# Patient Record
Sex: Female | Born: 1980 | Race: Black or African American | Hispanic: No | Marital: Married | Smoking: Never smoker
Health system: Southern US, Community
[De-identification: ages and names within clinical notes are randomized; demographics above are authoritative.]

## PROBLEM LIST (undated history)

## (undated) DIAGNOSIS — I10 Essential (primary) hypertension: Secondary | ICD-10-CM

## (undated) DIAGNOSIS — D649 Anemia, unspecified: Secondary | ICD-10-CM

## (undated) HISTORY — PX: TUBAL LIGATION: SHX77

---

## 2011-08-22 ENCOUNTER — Emergency Department (HOSPITAL_BASED_OUTPATIENT_CLINIC_OR_DEPARTMENT_OTHER)
Admission: EM | Admit: 2011-08-22 | Discharge: 2011-08-22 | Disposition: A | Discharge status: Home / Self Care | Payer: Self-pay | Attending: Emergency Medicine | Admitting: Emergency Medicine

## 2011-08-22 ENCOUNTER — Encounter (HOSPITAL_BASED_OUTPATIENT_CLINIC_OR_DEPARTMENT_OTHER): Payer: Self-pay | Admitting: *Deleted

## 2011-08-22 DIAGNOSIS — A499 Bacterial infection, unspecified: Secondary | ICD-10-CM | POA: Insufficient documentation

## 2011-08-22 DIAGNOSIS — B9689 Other specified bacterial agents as the cause of diseases classified elsewhere: Secondary | ICD-10-CM | POA: Insufficient documentation

## 2011-08-22 DIAGNOSIS — I10 Essential (primary) hypertension: Secondary | ICD-10-CM | POA: Insufficient documentation

## 2011-08-22 DIAGNOSIS — N760 Acute vaginitis: Secondary | ICD-10-CM | POA: Insufficient documentation

## 2011-08-22 HISTORY — DX: Essential (primary) hypertension: I10

## 2011-08-22 LAB — WET PREP, GENITAL
Trich, Wet Prep: NONE SEEN
Yeast Wet Prep HPF POC: NONE SEEN

## 2011-08-22 LAB — URINALYSIS, ROUTINE W REFLEX MICROSCOPIC
Glucose, UA: NEGATIVE mg/dL
Leukocytes, UA: NEGATIVE
Nitrite: NEGATIVE
Specific Gravity, Urine: 1.031 — ABNORMAL HIGH (ref 1.005–1.030)
pH: 5.5 (ref 5.0–8.0)

## 2011-08-22 LAB — GC/CHLAMYDIA PROBE AMP, GENITAL: GC Probe Amp, Genital: NEGATIVE

## 2011-08-22 LAB — URINE MICROSCOPIC-ADD ON

## 2011-08-22 MED ORDER — METRONIDAZOLE 500 MG PO TABS
500.0000 mg | ORAL_TABLET | Freq: Once | ORAL | Status: AC
  Administered 2011-08-22: 500 mg via ORAL
  Filled 2011-08-22: qty 1

## 2011-08-22 MED ORDER — METRONIDAZOLE 500 MG PO TABS: 500.0000 mg | ORAL_TABLET | Freq: Two times a day (BID) | ORAL | Status: AC

## 2011-08-22 NOTE — Discharge Instructions (Signed)
Please take all of your antibiotics. Bacterial Vaginosis Bacterial vaginosis (BV) is a vaginal infection where the normal balance of bacteria in the vagina is disrupted. The normal balance is then replaced by an overgrowth of certain bacteria. There are several different kinds of bacteria that can cause BV. BV is the most common vaginal infection in women of childbearing age. CAUSES   The cause of BV is not fully understood. BV develops when there is an increase or imbalance of harmful bacteria.   Some activities or behaviors can upset the normal balance of bacteria in the vagina and put women at increased risk including:   Having a new sex partner or multiple sex partners.   Douching.   Using an intrauterine device (IUD) for contraception.   It is not clear what role sexual activity plays in the development of BV. However, women that have never had sexual intercourse are rarely infected with BV.  Women do not get BV from toilet seats, bedding, swimming pools or from touching objects around them.  SYMPTOMS   Grey vaginal discharge.   A fish-like odor with discharge, especially after sexual intercourse.   Itching or burning of the vagina and vulva.   Burning or pain with urination.   Some women have no signs or symptoms at all.  DIAGNOSIS  Your caregiver must examine the vagina for signs of BV. Your caregiver will perform lab tests and look at the sample of vaginal fluid through a microscope. They will look for bacteria and abnormal cells (clue cells), a pH test higher than 4.5, and a positive amine test all associated with BV.  RISKS AND COMPLICATIONS   Pelvic inflammatory disease (PID).   Infections following gynecology surgery.   Developing HIV.   Developing herpes virus.  TREATMENT  Sometimes BV will clear up without treatment. However, all women with symptoms of BV should be treated to avoid complications, especially if gynecology surgery is planned. Female partners  generally do not need to be treated. However, BV may spread between female sex partners so treatment is helpful in preventing a recurrence of BV.   BV may be treated with antibiotics. The antibiotics come in either pill or vaginal cream forms. Either can be used with nonpregnant or pregnant women, but the recommended dosages differ. These antibiotics are not harmful to the baby.   BV can recur after treatment. If this happens, a second round of antibiotics will often be prescribed.   Treatment is important for pregnant women. If not treated, BV can cause a premature delivery, especially for a pregnant woman who had a premature birth in the past. All pregnant women who have symptoms of BV should be checked and treated.   For chronic reoccurrence of BV, treatment with a type of prescribed gel vaginally twice a week is helpful.  HOME CARE INSTRUCTIONS   Finish all medication as directed by your caregiver.   Do not have sex until treatment is completed.   Tell your sexual partner that you have a vaginal infection. They should see their caregiver and be treated if they have problems, such as a mild rash or itching.   Practice safe sex. Use condoms. Only have 1 sex partner.  PREVENTION  Basic prevention steps can help reduce the risk of upsetting the natural balance of bacteria in the vagina and developing BV:  Do not have sexual intercourse (be abstinent).   Do not douche.   Use all of the medicine prescribed for treatment of BV, even if  the signs and symptoms go away.   Tell your sex partner if you have BV. That way, they can be treated, if needed, to prevent reoccurrence.  SEEK MEDICAL CARE IF:   Your symptoms are not improving after 3 days of treatment.   You have increased discharge, pain, or fever.  MAKE SURE YOU:   Understand these instructions.   Will watch your condition.   Will get help right away if you are not doing well or get worse.  FOR MORE INFORMATION  Division of  STD Prevention (DSTDP), Centers for Disease Control and Prevention: [employer] American Social Health Association (ASHA): www.ashastd.org  Document Released: 03/17/2005 Document Revised: 03/06/2011 Document Reviewed: 09/07/2008 [facility] Patient Information 2012 [place], [place].

## 2011-08-22 NOTE — ED Notes (Signed)
Pt amb to room 9 with quick steady gait smiling in nad.  Pt reports 3 weeks of vaginal discharge, itching and foul odor. rx flagyl by her pcp, took 4 days of it then stopped, "I thought it was gone..." symptoms resolved, then returned when she used a new body wash.

## 2011-08-22 NOTE — ED Provider Notes (Signed)
History     [name]: [id number]  Arrival date & time 08/22/11  8295   First MD Initiated Contact with Patient 08/22/11 [phone]      Chief Complaint  Patient presents with  . Vaginal Discharge    (Consider location/radiation/quality/duration/timing/severity/associated sxs/prior treatment) HPI Patient is a 31 year old female with history of bacterial vaginosis who presents today complaining of foul-smelling vaginal discharge. Patient was last treated for bacterial vaginosis 2 weeks ago. She only took 4 of her 7 days of antibiotics as she thought her symptoms were gone. Patient reports that her symptoms did not recur a total week after this. Patient said that her symptoms have just been present for one day. She has no suspicion that she could have a sexual transmitted disease. She does not that she could be pregnant as she had tubal ligation in August. Patient endorses small amount of foul-smelling vaginal discharge. She denies nausea, vomiting, abdominal pain, or dysuria. Patient has absolutely no urinary symptoms. We did discuss that bacterial vaginosis only needs to be treated in her case if she is bothered by her symptoms. Patient did report that she felt her symptoms were significant enough that she would like to be treated if this is what she has today. She denies pain Past Medical History  Diagnosis Date  . Hypertension     History reviewed. No pertinent past surgical history.  History reviewed. No pertinent family history.  History  Substance Use Topics  . Smoking status: Not on file  . Smokeless tobacco: Not on file  . Alcohol Use:     OB History    Grav Para Term Preterm Abortions TAB SAB Ect Mult Living                  Review of Systems  Constitutional: Negative.   HENT: Negative.   Eyes: Negative.   Respiratory: Negative.   Cardiovascular: Negative.   Gastrointestinal: Negative.   Genitourinary: Positive for vaginal discharge.  Musculoskeletal: Negative.   Skin:  Negative.   Neurological: Negative.   Hematological: Negative.   Psychiatric/Behavioral: Negative.   All other systems reviewed and are negative.    Allergies  Review of patient's allergies indicates no known allergies.  Home Medications  No current outpatient prescriptions on file.  BP 146/98  Pulse 89  Temp(Src) 98.2 F (36.8 C) (Oral)  Resp 18  SpO2 100%  LMP 07/27/2011  Physical Exam  Nursing note and vitals reviewed. GEN: Well-developed, well-nourished female in no distress HEENT: Atraumatic, normocephalic. EYES: PERRLA BL, no scleral icterus. NECK: Trachea midline, no meningismus CV: regular rate and rhythm. No murmurs rubs or gallops. PULM: No respiratory distress.  No crackles, wheezes, or rales. GI: soft, non-tender. No guarding, rebound, or tenderness. + bowel sounds  GU: Speculum exam with moderate amount of fishy smelling yellowish vaginal discharge, no cervical or adnexal motion tenderness Neuro: cranial nerves 2-12 grossly intact, no abnormalities of strength or sensation, A and O x 3 MSK: Patient moves all 4 extremities symmetrically, no deformity, edema, or injury noted Skin: No rashes petechiae, purpura, or jaundice Psych: no abnormality of mood   ED Course  Procedures (including critical care time)  Labs Reviewed  URINALYSIS, ROUTINE W REFLEX MICROSCOPIC - Abnormal; Notable for the following:    APPearance CLOUDY (*)    Specific Gravity, Urine 1.031 (*)    Hgb urine dipstick TRACE (*)    All other components within normal limits  WET PREP, GENITAL - Abnormal; Notable for the following:  Clue Cells Wet Prep HPF POC FEW (*)    WBC, Wet Prep HPF POC FEW (*)    All other components within normal limits  URINE MICROSCOPIC-ADD ON - Abnormal; Notable for the following:    Squamous Epithelial / LPF MANY (*)    Bacteria, UA FEW (*)    All other components within normal limits  PREGNANCY, URINE  GC/CHLAMYDIA PROBE AMP, GENITAL   No results  found.   1. Bacterial vaginosis       MDM  Patient was evaluated by myself. Based on presentation pelvic exam was performed. Patient had no abdominal pain or other concerning symptoms. Urinalysis, urine pregnancy, gonorrhea and Chlamydia, and wet prep were all checked today. Pelvic exam was consistent with bacterial vaginosis. Pregnancy was negative, urinalysis was unremarkable, and wet prep did show clue cells. Patient was given a dose of Flagyl here and discharged with a seven-day prescription for this. She can followup with her regular doctor as needed.        Cyndra Numbers, MD 08/22/11 [phone]

## 2012-07-15 ENCOUNTER — Emergency Department (HOSPITAL_BASED_OUTPATIENT_CLINIC_OR_DEPARTMENT_OTHER)
Admission: EM | Admit: 2012-07-15 | Discharge: 2012-07-15 | Disposition: A | Discharge status: Home / Self Care | Payer: Self-pay | Attending: Emergency Medicine | Admitting: Emergency Medicine

## 2012-07-15 ENCOUNTER — Emergency Department (HOSPITAL_BASED_OUTPATIENT_CLINIC_OR_DEPARTMENT_OTHER): Payer: Self-pay

## 2012-07-15 ENCOUNTER — Encounter (HOSPITAL_BASED_OUTPATIENT_CLINIC_OR_DEPARTMENT_OTHER): Payer: Self-pay | Admitting: *Deleted

## 2012-07-15 DIAGNOSIS — Y990 Civilian activity done for income or pay: Secondary | ICD-10-CM | POA: Insufficient documentation

## 2012-07-15 DIAGNOSIS — X503XXA Overexertion from repetitive movements, initial encounter: Secondary | ICD-10-CM | POA: Insufficient documentation

## 2012-07-15 DIAGNOSIS — Y9289 Other specified places as the place of occurrence of the external cause: Secondary | ICD-10-CM | POA: Insufficient documentation

## 2012-07-15 DIAGNOSIS — S63509A Unspecified sprain of unspecified wrist, initial encounter: Secondary | ICD-10-CM | POA: Insufficient documentation

## 2012-07-15 DIAGNOSIS — Z79899 Other long term (current) drug therapy: Secondary | ICD-10-CM | POA: Insufficient documentation

## 2012-07-15 DIAGNOSIS — I10 Essential (primary) hypertension: Secondary | ICD-10-CM | POA: Insufficient documentation

## 2012-07-15 MED ORDER — OXYCODONE-ACETAMINOPHEN 5-325 MG PO TABS
1.0000 | ORAL_TABLET | Freq: Once | ORAL | Status: AC
  Administered 2012-07-15: 1 via ORAL
  Filled 2012-07-15 (×2): qty 1

## 2012-07-15 MED ORDER — IBUPROFEN 800 MG PO TABS: 800.0000 mg | ORAL_TABLET | Freq: Three times a day (TID) | ORAL | Status: DC

## 2012-07-15 MED ORDER — IBUPROFEN 800 MG PO TABS
800.0000 mg | ORAL_TABLET | Freq: Once | ORAL | Status: AC
  Administered 2012-07-15: 800 mg via ORAL
  Filled 2012-07-15: qty 1

## 2012-07-15 MED ORDER — OXYCODONE-ACETAMINOPHEN 5-325 MG PO TABS: 2.0000 | ORAL_TABLET | ORAL | Status: DC | PRN

## 2012-07-15 NOTE — ED Notes (Signed)
Pt c/o right wrist pain x 2 days w/o injury

## 2012-07-15 NOTE — ED Notes (Signed)
MD at bedside. 

## 2012-07-15 NOTE — ED Provider Notes (Signed)
History     [name]: [id number]  Arrival date & time 07/15/12  1354   First MD Initiated Contact with Patient 07/15/12 1359      Chief Complaint  Patient presents with  . Wrist Pain    (Consider location/radiation/quality/duration/timing/severity/associated sxs/prior treatment) The history is provided by the patient.  ASIYA CUTBIRTH is a 32 y.o. female hx of HTN here with R wrist pain. Woke up yesterday with R wrist pain. Took motrin without much relief. No injury. She does a lot of typing for work. No heavy lifting recently. No hx of arthritis. No numbness or weakness in fingers.    Past Medical History  Diagnosis Date  . Hypertension     Past Surgical History  Procedure Laterality Date  . Tubal ligation      History reviewed. No pertinent family history.  History  Substance Use Topics  . Smoking status: Never Smoker   . Smokeless tobacco: Not on file  . Alcohol Use: No    OB History   Grav Para Term Preterm Abortions TAB SAB Ect Mult Living                  Review of Systems  Musculoskeletal:       R wrist pain   All other systems reviewed and are negative.    Allergies  Review of patient's allergies indicates no known allergies.  Home Medications   Current Outpatient Rx  Name  Route  Sig  Dispense  Refill  . verapamil (COVERA HS) 180 MG (CO) 24 hr tablet   Oral   Take 180 mg by mouth at bedtime.           BP 147/100  Pulse 82  Temp(Src) 98.8 F (37.1 C) (Oral)  Resp 16  Ht 5\' 4"  (1.626 m)  Wt 180 lb (81.647 kg)  BMI 30.88 kg/m2  SpO2 100%  LMP 07/08/2012  Physical Exam  Nursing note and vitals reviewed. Constitutional: She is oriented to person, place, and time. She appears well-developed and well-nourished.  Slightly uncomfortable, holding R wrist   HENT:  Head: Normocephalic.  Eyes: Pupils are equal, round, and reactive to light.  Neck: Normal range of motion. Neck supple.  Cardiovascular: Normal rate.   Pulmonary/Chest: Effort  normal.  Abdominal: Soft.  Musculoskeletal:  R wrist slightly swollen and mildly tender. Dec ROM from pain. 2+ pulses, nl finger ROM. Dec hand grasp due to pain. Good capillary refill. No tenderness over the carpal tunnel. Nl sensation in fingers.   Neurological: She is alert and oriented to person, place, and time.  Skin: Skin is warm and dry.  Psychiatric: She has a normal mood and affect. Her behavior is normal. Judgment and thought content normal.    ED Course  Procedures (including critical care time)  Labs Reviewed - No data to display Dg Wrist Complete Right  07/15/2012  *RADIOLOGY REPORT*  Clinical Data: Wrist pain and swelling.  No known injury.  RIGHT WRIST - COMPLETE 3+ VIEW  Comparison: None.  Findings: The mineralization and alignment are normal.  There is no evidence of acute fracture or dislocation.  The joint spaces are preserved.  The ulnar variance is negative.  There is a questionable tiny radiodensity dorsal to the radiocarpal joint on the lateral view, not well seen on the additional views.  IMPRESSION: No acute osseous findings.  A tiny loose body posteriorly is difficult to exclude, but only clearly seen on one view.   Original Report Authenticated  By: Carey Bullocks, M.D.      No diagnosis found.    MDM  AZALIE HARBECK is a 32 y.o. female here with R wrist strain from likely repetitive motion. Xray showed possible loose body but no fracture. Velcro splint applied. I d/c her home on motrin and short course of percocet. I asked her to get repeat xray with ortho in a week.          Richardean Canal, MD 07/15/12 1452

## 2012-07-22 ENCOUNTER — Ambulatory Visit: Payer: Self-pay | Admitting: Family Medicine

## 2012-07-22 DIAGNOSIS — Z0289 Encounter for other administrative examinations: Secondary | ICD-10-CM

## 2013-10-06 ENCOUNTER — Emergency Department (HOSPITAL_BASED_OUTPATIENT_CLINIC_OR_DEPARTMENT_OTHER)
Admission: EM | Admit: 2013-10-06 | Discharge: 2013-10-06 | Discharge status: Against Medical Advice | Payer: Managed Care, Other (non HMO) | Attending: Emergency Medicine | Admitting: Emergency Medicine

## 2013-10-06 ENCOUNTER — Encounter (HOSPITAL_BASED_OUTPATIENT_CLINIC_OR_DEPARTMENT_OTHER): Payer: Self-pay | Admitting: Emergency Medicine

## 2013-10-06 DIAGNOSIS — I10 Essential (primary) hypertension: Secondary | ICD-10-CM | POA: Insufficient documentation

## 2013-10-06 DIAGNOSIS — H538 Other visual disturbances: Secondary | ICD-10-CM | POA: Insufficient documentation

## 2013-10-06 DIAGNOSIS — H5789 Other specified disorders of eye and adnexa: Secondary | ICD-10-CM | POA: Insufficient documentation

## 2013-10-06 NOTE — ED Notes (Signed)
Pt having left eye irritation and swelling since Tuesday.  Blurred vision at times.

## 2013-10-06 NOTE — ED Notes (Signed)
Pt informed registration that she had an appointment and needed to leave.

## 2014-07-03 ENCOUNTER — Emergency Department (HOSPITAL_BASED_OUTPATIENT_CLINIC_OR_DEPARTMENT_OTHER)
Admission: EM | Admit: 2014-07-03 | Discharge: 2014-07-03 | Disposition: A | Discharge status: Home / Self Care | Payer: Managed Care, Other (non HMO) | Attending: Emergency Medicine | Admitting: Emergency Medicine

## 2014-07-03 ENCOUNTER — Encounter (HOSPITAL_BASED_OUTPATIENT_CLINIC_OR_DEPARTMENT_OTHER): Payer: Self-pay

## 2014-07-03 DIAGNOSIS — R002 Palpitations: Secondary | ICD-10-CM | POA: Insufficient documentation

## 2014-07-03 DIAGNOSIS — I10 Essential (primary) hypertension: Secondary | ICD-10-CM | POA: Diagnosis not present

## 2014-07-03 DIAGNOSIS — L509 Urticaria, unspecified: Secondary | ICD-10-CM | POA: Diagnosis not present

## 2014-07-03 DIAGNOSIS — Z79899 Other long term (current) drug therapy: Secondary | ICD-10-CM | POA: Diagnosis not present

## 2014-07-03 DIAGNOSIS — R110 Nausea: Secondary | ICD-10-CM | POA: Diagnosis not present

## 2014-07-03 LAB — CBC
HEMATOCRIT: 36.3 % (ref 36.0–46.0)
HEMOGLOBIN: 12.4 g/dL (ref 12.0–15.0)
MCH: 31.3 pg (ref 26.0–34.0)
MCHC: 34.2 g/dL (ref 30.0–36.0)
MCV: 91.7 fL (ref 78.0–100.0)
Platelets: 257 10*3/uL (ref 150–400)
RBC: 3.96 MIL/uL (ref 3.87–5.11)
RDW: 12.2 % (ref 11.5–15.5)
WBC: 5.6 10*3/uL (ref 4.0–10.5)

## 2014-07-03 LAB — TROPONIN I: Troponin I: <0.03 ng/mL (ref <0.031)

## 2014-07-03 LAB — BASIC METABOLIC PANEL
ANION GAP: 9 (ref 5–15)
BUN: 11 mg/dL (ref 6–23)
CALCIUM: 9 mg/dL (ref 8.4–10.5)
CO2: 23 mmol/L (ref 19–32)
Chloride: 104 mmol/L (ref 96–112)
Creatinine, Ser: 0.68 mg/dL (ref 0.50–1.10)
GFR calc Af Amer: >90 mL/min (ref >90)
Glucose, Bld: 102 mg/dL — ABNORMAL HIGH (ref 70–99)
Potassium: 3.5 mmol/L (ref 3.5–5.1)
SODIUM: 136 mmol/L (ref 135–145)

## 2014-07-03 MED ORDER — DEXAMETHASONE SODIUM PHOSPHATE 10 MG/ML IJ SOLN
10.0000 mg | Freq: Once | INTRAMUSCULAR | Status: DC
  Filled 2014-07-03: qty 1

## 2014-07-03 MED ORDER — DEXAMETHASONE SODIUM PHOSPHATE 10 MG/ML IJ SOLN: 10.0000 mg | Freq: Once | INTRAMUSCULAR | Status: DC

## 2014-07-03 MED ORDER — DEXAMETHASONE SODIUM PHOSPHATE 10 MG/ML IJ SOLN
10.0000 mg | Freq: Once | INTRAMUSCULAR | Status: AC
Start: 2014-07-03 — End: 2014-07-03
  Administered 2014-07-03: 10 mg via INTRAMUSCULAR

## 2014-07-03 NOTE — ED Provider Notes (Signed)
[name]: [id number]     Arrival date & time 07/03/14  1321 History   First MD Initiated Contact with Patient 07/03/14 1327     Chief Complaint  Patient presents with  . Urticaria  . Palpitations     (Consider location/radiation/quality/duration/timing/severity/associated sxs/prior Treatment) HPI Comments: 34 year old female complaining of a rash 2 days. Reports yesterday she noticed red marks on her arms, spreading towards her back and sides today. Denies difficulty breathing or swallowing. No known allergies. Reports she was around a family member who has multiple cats, and she is not sure if she is allergic to cats. Denies any new soaps, detergents, lotions. No contacts with similar rash. The rash is itchy. No aggravating or alleviating factors. Also reports an episode of heart fluttering that woke her up from sleep earlier today. States this lasted for about 1 minute and went away on its own. At that time, she had associated nausea. Denies chest pain or shortness of breath. History of hypertension, no other history of heart disease. Denies family history of early heart disease. Reports she is under a lot of stress at this time as her grandmother at sick and she has been taking care of her.  Patient is a 34 y.o. female presenting with urticaria and palpitations. The history is provided by the patient.  Urticaria Associated symptoms include nausea and a rash.  Palpitations Associated symptoms: nausea     Past Medical History  Diagnosis Date  . Hypertension    Past Surgical History  Procedure Laterality Date  . Tubal ligation     No family history on file. History  Substance Use Topics  . Smoking status: Never Smoker   . Smokeless tobacco: Not on file  . Alcohol Use: No   OB History    No data available     Review of Systems  Cardiovascular: Positive for palpitations.  Gastrointestinal: Positive for nausea.  Skin: Positive for rash.  All other systems reviewed and are  negative.     Allergies  Review of patient's allergies indicates no known allergies.  Home Medications   Prior to Admission medications   Medication Sig Start Date End Date Taking? Authorizing Provider  imipramine (TOFRANIL) 10 MG tablet Take 10 mg by mouth at bedtime.   Yes Historical Provider, MD  valsartan-hydrochlorothiazide (DIOVAN-HCT) 160-25 MG per tablet Take 1 tablet by mouth daily.   Yes Historical Provider, MD   BP 167/100 mmHg  Pulse 87  Temp(Src) 98.5 F (36.9 C) (Oral)  Resp 18  Ht 5\' 4"  (1.626 m)  Wt 191 lb (86.637 kg)  BMI 32.77 kg/m2  SpO2 100%  LMP 06/03/2014 Physical Exam  Constitutional: She is oriented to person, place, and time. She appears well-developed and well-nourished. No distress.  HENT:  Head: Normocephalic and atraumatic.  Mouth/Throat: Oropharynx is clear and moist.  Eyes: Conjunctivae and EOM are normal. Pupils are equal, round, and reactive to light.  Neck: Normal range of motion. Neck supple. No JVD present.  Cardiovascular: Normal rate, regular rhythm, normal heart sounds and intact distal pulses.   No extremity edema.  Pulmonary/Chest: Effort normal and breath sounds normal. No respiratory distress.  Abdominal: Soft. Bowel sounds are normal. There is no tenderness.  Musculoskeletal: Normal range of motion. She exhibits no edema.  Neurological: She is alert and oriented to person, place, and time. She has normal strength. No sensory deficit.  Speech fluent, goal oriented. Moves limbs without ataxia. Equal grip strength bilateral.  Skin: Skin is warm and dry.  She is not diaphoretic.  Urticaria on bilateral arms, lower back and sides.  Psychiatric: She has a normal mood and affect. Her behavior is normal.  Nursing note and vitals reviewed.   ED Course  Procedures (including critical care time) Labs Review Labs Reviewed  BASIC METABOLIC PANEL - Abnormal; Notable for the following:    Glucose, Bld 102 (*)    All other components  within normal limits  CBC  TROPONIN I    Imaging Review No results found.   EKG Interpretation   Date/Time:  Monday July 03 2014 13:37:59 EDT Ventricular Rate:  77 PR Interval:  176 QRS Duration: 78 QT Interval:  386 QTC Calculation: 436 R Axis:   -19 Text Interpretation:  Normal sinus rhythm Normal ECG No old tracing to  compare Confirmed by MILLER  MD, BRIAN ([id number]) on 07/03/2014 1:41:00 PM      MDM   Final diagnoses:  Urticaria  Palpitations   Patient with urticaria. Non-toxic appearing, NAD. Hypertensive, vitals otherwise stable. No respiratory or airway compromise. Palpitations have since subsided, only lasted for a minute. Admits to being under increased stress. Possibly from the increased stress, however will obtain labs. EKG normal. Doubt PE. PERC negative.  Workup negative. Heart score 1. Palpitations have still not returned. Possibly due to stress of taking care of her grandmother. Dose of Decadron given for urticaria, advised Benadryl for itching. Follow-up with PCP within one week. Stable for discharge. Return precautions given. Patient states understanding of treatment care plan and is agreeable.  Kathrynn Speed, PA-C 07/03/14 1541  Eber Hong, MD 07/03/14 2035

## 2014-07-03 NOTE — ED Notes (Signed)
C/o scattered hives-started yesterday

## 2014-07-03 NOTE — ED Notes (Signed)
Pt also states she had nausea and "heart fluttering" this am that she wants addressed

## 2014-07-03 NOTE — ED Notes (Signed)
PA at bedside.

## 2014-07-03 NOTE — Discharge Instructions (Signed)
You may take Benadryl every 6 hours as needed for itching.  Hives Hives are itchy, red, swollen areas of the skin. They can vary in size and location on your body. Hives can come and go for hours or several days (acute hives) or for several weeks (chronic hives). Hives do not spread from person to person (noncontagious). They may get worse with scratching, exercise, and emotional stress. CAUSES   Allergic reaction to food, additives, or drugs.  Infections, including the common cold.  Illness, such as vasculitis, lupus, or thyroid disease.  Exposure to sunlight, heat, or cold.  Exercise.  Stress.  Contact with chemicals. SYMPTOMS   Red or white swollen patches on the skin. The patches may change size, shape, and location quickly and repeatedly.  Itching.  Swelling of the hands, feet, and face. This may occur if hives develop deeper in the skin. DIAGNOSIS  Your caregiver can usually tell what is wrong by performing a physical exam. Skin or blood tests may also be done to determine the cause of your hives. In some cases, the cause cannot be determined. TREATMENT  Mild cases usually get better with medicines such as antihistamines. Severe cases may require an emergency epinephrine injection. If the cause of your hives is known, treatment includes avoiding that trigger.  HOME CARE INSTRUCTIONS   Avoid causes that trigger your hives.  Take antihistamines as directed by your caregiver to reduce the severity of your hives. Non-sedating or low-sedating antihistamines are usually recommended. Do not drive while taking an antihistamine.  Take any other medicines prescribed for itching as directed by your caregiver.  Wear loose-fitting clothing.  Keep all follow-up appointments as directed by your caregiver. SEEK MEDICAL CARE IF:   You have persistent or severe itching that is not relieved with medicine.  You have painful or swollen joints. SEEK IMMEDIATE MEDICAL CARE IF:   You  have a fever.  Your tongue or lips are swollen.  You have trouble breathing or swallowing.  You feel tightness in the throat or chest.  You have abdominal pain. These problems may be the first sign of a life-threatening allergic reaction. Call your local emergency services (911 in [place] MAKE SURE YOU:   Understand these instructions.  Will watch your condition.  Will get help right away if you are not doing well or get worse. Document Released: 03/17/2005 Document Revised: 03/22/2013 Document Reviewed: 06/10/2011 [facility] Patient Information 2015 [place], [place]. This information is not intended to replace advice given to you by your health care provider. Make sure you discuss any questions you have with your health care provider.  Palpitations A palpitation is the feeling that your heartbeat is irregular or is faster than normal. It may feel like your heart is fluttering or skipping a beat. Palpitations are usually not a serious problem. However, in some cases, you may need further medical evaluation. CAUSES  Palpitations can be caused by:  Smoking.  Caffeine or other stimulants, such as diet pills or energy drinks.  Alcohol.  Stress and anxiety.  Strenuous physical activity.  Fatigue.  Certain medicines.  Heart disease, especially if you have a history of irregular heart rhythms (arrhythmias), such as atrial fibrillation, atrial flutter, or supraventricular tachycardia.  An improperly working pacemaker or defibrillator. DIAGNOSIS  To find the cause of your palpitations, your health care provider will take your medical history and perform a physical exam. Your health care provider may also have you take a test called an ambulatory electrocardiogram (ECG). An ECG  records your heartbeat patterns over a 24-hour period. You may also have other tests, such as:  Transthoracic echocardiogram (TTE). During echocardiography, sound waves are used to evaluate how blood flows  through your heart.  Transesophageal echocardiogram (TEE).  Cardiac monitoring. This allows your health care provider to monitor your heart rate and rhythm in real time.  Holter monitor. This is a portable device that records your heartbeat and can help diagnose heart arrhythmias. It allows your health care provider to track your heart activity for several days, if needed.  Stress tests by exercise or by giving medicine that makes the heart beat faster. TREATMENT  Treatment of palpitations depends on the cause of your symptoms and can vary greatly. Most cases of palpitations do not require any treatment other than time, relaxation, and monitoring your symptoms. Other causes, such as atrial fibrillation, atrial flutter, or supraventricular tachycardia, usually require further treatment. HOME CARE INSTRUCTIONS   Avoid:  Caffeinated coffee, tea, soft drinks, diet pills, and energy drinks.  Chocolate.  Alcohol.  Stop smoking if you smoke.  Reduce your stress and anxiety. Things that can help you relax include:  A method of controlling things in your body, such as your heartbeats, with your mind (biofeedback).  Yoga.  Meditation.  Physical activity such as swimming, jogging, or walking.  Get plenty of rest and sleep. SEEK MEDICAL CARE IF:   You continue to have a fast or irregular heartbeat beyond 24 hours.  Your palpitations occur more often. SEEK IMMEDIATE MEDICAL CARE IF:  You have chest pain or shortness of breath.  You have a severe headache.  You feel dizzy or you faint. MAKE SURE YOU:  Understand these instructions.  Will watch your condition.  Will get help right away if you are not doing well or get worse. Document Released: 03/14/2000 Document Revised: 03/22/2013 Document Reviewed: 05/16/2011 [facility] Patient Information 2015 [place]. This information is not intended to replace advice given to you by your health care provider. Make sure you discuss  any questions you have with your health care provider.

## 2014-11-02 ENCOUNTER — Emergency Department (HOSPITAL_BASED_OUTPATIENT_CLINIC_OR_DEPARTMENT_OTHER)
Admission: EM | Admit: 2014-11-02 | Discharge: 2014-11-02 | Disposition: A | Discharge status: Home / Self Care | Payer: Managed Care, Other (non HMO) | Attending: Emergency Medicine | Admitting: Emergency Medicine

## 2014-11-02 ENCOUNTER — Encounter (HOSPITAL_BASED_OUTPATIENT_CLINIC_OR_DEPARTMENT_OTHER): Payer: Self-pay

## 2014-11-02 DIAGNOSIS — I10 Essential (primary) hypertension: Secondary | ICD-10-CM

## 2014-11-02 DIAGNOSIS — Z862 Personal history of diseases of the blood and blood-forming organs and certain disorders involving the immune mechanism: Secondary | ICD-10-CM | POA: Diagnosis not present

## 2014-11-02 HISTORY — DX: Anemia, unspecified: D64.9

## 2014-11-02 LAB — BASIC METABOLIC PANEL
Anion gap: 8 (ref 5–15)
BUN: 11 mg/dL (ref 6–20)
CHLORIDE: 105 mmol/L (ref 101–111)
CO2: 25 mmol/L (ref 22–32)
CREATININE: 0.74 mg/dL (ref 0.44–1.00)
Calcium: 9.1 mg/dL (ref 8.9–10.3)
GFR calc Af Amer: >60 mL/min (ref >60)
GFR calc non Af Amer: >60 mL/min (ref >60)
Glucose, Bld: 117 mg/dL — ABNORMAL HIGH (ref 65–99)
POTASSIUM: 3.8 mmol/L (ref 3.5–5.1)
SODIUM: 138 mmol/L (ref 135–145)

## 2014-11-02 LAB — CBC WITH DIFFERENTIAL/PLATELET
BASOS PCT: 1 % (ref 0–1)
Basophils Absolute: 0 10*3/uL (ref 0.0–0.1)
EOS ABS: 0.2 10*3/uL (ref 0.0–0.7)
Eosinophils Relative: 4 % (ref 0–5)
HEMATOCRIT: 37.5 % (ref 36.0–46.0)
Hemoglobin: 12.7 g/dL (ref 12.0–15.0)
LYMPHS PCT: 33 % (ref 12–46)
Lymphs Abs: 1.7 10*3/uL (ref 0.7–4.0)
MCH: 31 pg (ref 26.0–34.0)
MCHC: 33.9 g/dL (ref 30.0–36.0)
MCV: 91.5 fL (ref 78.0–100.0)
MONO ABS: 0.4 10*3/uL (ref 0.1–1.0)
Monocytes Relative: 8 % (ref 3–12)
NEUTROS ABS: 2.9 10*3/uL (ref 1.7–7.7)
Neutrophils Relative %: 54 % (ref 43–77)
Platelets: 280 10*3/uL (ref 150–400)
RBC: 4.1 MIL/uL (ref 3.87–5.11)
RDW: 11.9 % (ref 11.5–15.5)
WBC: 5.3 10*3/uL (ref 4.0–10.5)

## 2014-11-02 MED ORDER — VERAPAMIL HCL ER 120 MG PO TBCR: 120.0000 mg | EXTENDED_RELEASE_TABLET | Freq: Every day | ORAL | Status: DC

## 2014-11-02 NOTE — Discharge Instructions (Signed)
Continue to follow your blood pressures at home. If they remain persistently elevated, begin taking the verapamil you have been prescribed today.  Follow-up with your primary Dr. in the next week to be rechecked.   Hypertension Hypertension, commonly called high blood pressure, is when the force of blood pumping through your arteries is too strong. Your arteries are the blood vessels that carry blood from your heart throughout your body. A blood pressure reading consists of a higher number over a lower number, such as 110/72. The higher number (systolic) is the pressure inside your arteries when your heart pumps. The lower number (diastolic) is the pressure inside your arteries when your heart relaxes. Ideally you want your blood pressure below 120/80. Hypertension forces your heart to work harder to pump blood. Your arteries may become narrow or stiff. Having hypertension puts you at risk for heart disease, stroke, and other problems.  RISK FACTORS Some risk factors for high blood pressure are controllable. Others are not.  Risk factors you cannot control include:   Race. You may be at higher risk if you are African American.  Age. Risk increases with age.  Gender. Men are at higher risk than women before age 28 years. After age 26, women are at higher risk than men. Risk factors you can control include:  Not getting enough exercise or physical activity.  Being overweight.  Getting too much fat, sugar, calories, or salt in your diet.  Drinking too much alcohol. SIGNS AND SYMPTOMS Hypertension does not usually cause signs or symptoms. Extremely high blood pressure (hypertensive crisis) may cause headache, anxiety, shortness of breath, and nosebleed. DIAGNOSIS  To check if you have hypertension, your health care provider will measure your blood pressure while you are seated, with your arm held at the level of your heart. It should be measured at least twice using the same arm. Certain  conditions can cause a difference in blood pressure between your right and left arms. A blood pressure reading that is higher than normal on one occasion does not mean that you need treatment. If one blood pressure reading is high, ask your health care provider about having it checked again. TREATMENT  Treating high blood pressure includes making lifestyle changes and possibly taking medicine. Living a healthy lifestyle can help lower high blood pressure. You may need to change some of your habits. Lifestyle changes may include:  Following the DASH diet. This diet is high in fruits, vegetables, and whole grains. It is low in salt, red meat, and added sugars.  Getting at least 2 hours of brisk physical activity every week.  Losing weight if necessary.  Not smoking.  Limiting alcoholic beverages.  Learning ways to reduce stress. If lifestyle changes are not enough to get your blood pressure under control, your health care provider may prescribe medicine. You may need to take more than one. Work closely with your health care provider to understand the risks and benefits. HOME CARE INSTRUCTIONS  Have your blood pressure rechecked as directed by your health care provider.   Take medicines only as directed by your health care provider. Follow the directions carefully. Blood pressure medicines must be taken as prescribed. The medicine does not work as well when you skip doses. Skipping doses also puts you at risk for problems.   Do not smoke.   Monitor your blood pressure at home as directed by your health care provider. SEEK MEDICAL CARE IF:   You think you are having a reaction to  medicines taken.  You have recurrent headaches or feel dizzy.  You have swelling in your ankles.  You have trouble with your vision. SEEK IMMEDIATE MEDICAL CARE IF:  You develop a severe headache or confusion.  You have unusual weakness, numbness, or feel faint.  You have severe chest or abdominal  pain.  You vomit repeatedly.  You have trouble breathing. MAKE SURE YOU:   Understand these instructions.  Will watch your condition.  Will get help right away if you are not doing well or get worse. Document Released: 03/17/2005 Document Revised: 08/01/2013 Document Reviewed: 01/07/2013 [facility] Patient Information 2015 [place], [place]. This information is not intended to replace advice given to you by your health care provider. Make sure you discuss any questions you have with your health care provider.

## 2014-11-02 NOTE — ED Provider Notes (Signed)
[name]: [id number]     Arrival date & time 11/02/14  1507 History   First MD Initiated Contact with Patient 11/02/14 1531     Chief Complaint  Patient presents with  . Hypertension     (Consider location/radiation/quality/duration/timing/severity/associated sxs/prior Treatment) HPI Comments: Patient is a 34 year old female with past medical history of hypertension currently taking no medications. She presents today for evaluation of elevated blood pressure. She was at work this afternoon and her blood pressure was reading 150s over 120s. She was advised by her coworkers and supervisors to come to be checked out. She is otherwise asymptomatic. She denies any chest pain, headache, or shortness of breath. She was taking verapamil up until 2 or 3 months ago but stopped taking this due to her blood pressure improving with diet and exercise. She also has a history of anemia and is requesting to have her iron level checked.  Patient is a 34 y.o. female presenting with hypertension. The history is provided by the patient.  Hypertension This is a new problem. The problem occurs constantly. The problem has not changed since onset.Pertinent negatives include no chest pain, no headaches and no shortness of breath. Nothing aggravates the symptoms. Nothing relieves the symptoms. She has tried nothing for the symptoms. The treatment provided no relief.    Past Medical History  Diagnosis Date  . Hypertension   . Anemia    Past Surgical History  Procedure Laterality Date  . Tubal ligation     No family history on file. History  Substance Use Topics  . Smoking status: Never Smoker   . Smokeless tobacco: Not on file  . Alcohol Use: No   OB History    No data available     Review of Systems  Respiratory: Negative for shortness of breath.   Cardiovascular: Negative for chest pain.  Neurological: Negative for headaches.  All other systems reviewed and are negative.     Allergies  Review of  patient's allergies indicates no known allergies.  Home Medications   Prior to Admission medications   Not on File   BP 129/105 mmHg  Pulse 76  Temp(Src) 98.2 F (36.8 C) (Oral)  Resp 16  Ht 5\' 4"  (1.626 m)  Wt 187 lb (84.823 kg)  BMI 32.08 kg/m2  SpO2 100% Physical Exam  Constitutional: She is oriented to person, place, and time. She appears well-developed and well-nourished. No distress.  HENT:  Head: Normocephalic and atraumatic.  Mouth/Throat: Oropharynx is clear and moist.  Eyes: EOM are normal. Pupils are equal, round, and reactive to light.  Neck: Normal range of motion. Neck supple.  Cardiovascular: Normal rate and regular rhythm.  Exam reveals no gallop and no friction rub.   No murmur heard. Pulmonary/Chest: Effort normal and breath sounds normal. No respiratory distress. She has no wheezes.  Abdominal: Soft. Bowel sounds are normal. She exhibits no distension. There is no tenderness.  Musculoskeletal: Normal range of motion. She exhibits no edema.  Neurological: She is alert and oriented to person, place, and time. No cranial nerve deficit. She exhibits normal muscle tone. Coordination normal.  Skin: Skin is warm and dry. She is not diaphoretic.  Nursing note and vitals reviewed.   ED Course  Procedures (including critical care time) Labs Review Labs Reviewed  BASIC METABOLIC PANEL  CBC WITH DIFFERENTIAL/PLATELET    Imaging Review No results found.   EKG Interpretation None      MDM   Final diagnoses:  None    Laboratory  studies are unremarkable and blood pressures have been slightly elevated while in the ER, however not in need of intervention. There is no evidence for endorgan damage and she is appearing very comfortable. She has been on verapamil in the past and I have agreed to prescribe this for her to begin if her blood pressures are consistently elevated at home.    Geoffery Lyons, MD 11/02/14 [phone]

## 2014-11-02 NOTE — ED Notes (Signed)
C/o HTN-noncompliant with meds x 2-3 months-also requesting to have iron level checked

## 2015-03-22 ENCOUNTER — Encounter (HOSPITAL_BASED_OUTPATIENT_CLINIC_OR_DEPARTMENT_OTHER): Payer: Self-pay

## 2015-03-22 ENCOUNTER — Emergency Department (HOSPITAL_BASED_OUTPATIENT_CLINIC_OR_DEPARTMENT_OTHER)
Admission: EM | Admit: 2015-03-22 | Discharge: 2015-03-22 | Disposition: A | Discharge status: Home / Self Care | Payer: 59 | Attending: Emergency Medicine | Admitting: Emergency Medicine

## 2015-03-22 DIAGNOSIS — Z862 Personal history of diseases of the blood and blood-forming organs and certain disorders involving the immune mechanism: Secondary | ICD-10-CM | POA: Insufficient documentation

## 2015-03-22 DIAGNOSIS — Z79899 Other long term (current) drug therapy: Secondary | ICD-10-CM | POA: Insufficient documentation

## 2015-03-22 DIAGNOSIS — J029 Acute pharyngitis, unspecified: Secondary | ICD-10-CM | POA: Diagnosis not present

## 2015-03-22 DIAGNOSIS — I10 Essential (primary) hypertension: Secondary | ICD-10-CM | POA: Diagnosis not present

## 2015-03-22 LAB — RAPID STREP SCREEN (MED CTR MEBANE ONLY): STREPTOCOCCUS, GROUP A SCREEN (DIRECT): NEGATIVE

## 2015-03-22 MED ORDER — CEPACOL SORE THROAT + COATING 15-5 MG MT LOZG: 1.0000 | LOZENGE | OROMUCOSAL | Status: DC | PRN

## 2015-03-22 NOTE — ED Notes (Signed)
Sore throat x 2 days

## 2015-03-22 NOTE — Discharge Instructions (Signed)

## 2015-03-22 NOTE — ED Notes (Signed)
DC instructions reviewed with pt, discussed Rx as prescribed by medical staff, also encouraging PO fluids and increasing handwashing. Work note also provided by VF CorporationPA-C. Opportunity for questions provided

## 2015-03-22 NOTE — ED Provider Notes (Signed)
[name]: [id number]     Arrival date & time 03/22/15  1649 History   First Warner Initiated Contact with Patient 03/22/15 1812     Chief Complaint  Patient presents with  . Sore Throat     (Consider location/radiation/quality/duration/timing/severity/associated sxs/prior Treatment) HPI   Patient presents to the emergency department with complaints of scratchy and sore throat for 2 days. 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 Her symptoms are made worse by talking in improved with resting her voice. She left work early today and therefore came to the emergency department for further evaluation. She denies having any fevers, ear pain, coughing, chest pain, back pain, nausea, vomiting, diarrhea  Past Medical History  Diagnosis Date  . Hypertension   . Anemia    Past Surgical History  Procedure Laterality Date  . Tubal ligation     No family history on file. Social History  Substance Use Topics  . Smoking status: Never Smoker   . Smokeless tobacco: None  . Alcohol Use: No   OB History    No data available     Review of Systems  Review of Systems All other systems negative except as documented in the HPI. All pertinent positives and negatives as reviewed in the HPI.   Allergies  Review of patient's allergies indicates no known allergies.  Home Medications   Prior to Admission medications   Medication Sig Start Date End Date Taking? Authorizing Provider  FLUoxetine (PROZAC) 20 MG capsule Take 20 mg by mouth daily.   Yes Historical Provider, Warner  imipramine (TOFRANIL) 10 MG tablet Take 10 mg by mouth at bedtime.   Yes Historical Provider, Warner  losartan-hydrochlorothiazide (HYZAAR) 100-25 MG tablet Take 1 tablet by mouth daily.   Yes Historical Provider, Warner  CEPACOL SORE THROAT + COATING 15-5 MG LOZG Use as directed 1 lozenge in the mouth or throat every 2 (two) hours as needed. 03/22/15   Connie Warner [place], PA-C   BP 138/92 mmHg  Pulse 87  Temp(Src) 98.4 F (36.9  C) (Oral)  Resp 18  Ht 5\' 4"  (1.626 m)  Wt 86.183 kg  BMI 32.60 kg/m2  SpO2 99% Physical Exam  Constitutional: She appears well-developed and well-nourished. No distress.  HENT:  Head: Normocephalic and atraumatic.  Right Ear: Tympanic membrane and ear canal normal.  Left Ear: Tympanic membrane and ear canal normal.  Nose: Nose normal.  Mouth/Throat: Uvula is midline and mucous membranes are normal. Posterior oropharyngeal erythema present. No oropharyngeal exudate, posterior oropharyngeal edema or tonsillar abscesses.  Throat is mildly erythematous   Eyes: Pupils are equal, round, and reactive to light.  Neck: Normal range of motion. Neck supple.  Cardiovascular: Normal rate and regular rhythm.   Pulmonary/Chest: Effort normal.  Abdominal: Soft.  No signs of abdominal distention  Musculoskeletal:  No LE swelling  Lymphadenopathy:    She has no cervical adenopathy.       Right cervical: No superficial cervical adenopathy present.      Left cervical: No superficial cervical adenopathy present.  Neurological: She is alert.  Acting at baseline  Skin: Skin is warm and dry. No rash noted.  Nursing note and vitals reviewed.   ED Course  Procedures (including critical care time) Labs Review Labs Reviewed  RAPID STREP SCREEN (NOT AT [facility])  CULTURE, GROUP A STREP    Imaging Review No results found. I have personally reviewed and evaluated these images and lab results as part of my medical decision-making.  EKG Interpretation None     Rapid strep screen (Final result) Component (Lab Inquiry)    Collection Time Result Time Streptococcus, Group A Screen (Direct)   03/22/15 16:55:00 03/22/15 17:21:17  NEGATIVE    (Comment)    (NOTE)  A Rapid Antigen test may result negative if the antigen level in the  sample is below the detection level of this test. The FDA has not  cleared this test as a stand-alone test therefore the rapid antigen  negative result has  reflexed to a Group A Strep culture.                   Culture, Group A Strep (Final result) Component (Lab Inquiry)    Collection Time Result Time Strep A Culture   03/22/15 16:55:00 03/24/15 09:35:26  Negative    (Comment)    (NOTE)  Performed At: [facility]  [address] [place], [place] [id number]  Connie Warner [id number]                 MDM   Final diagnoses:  Sore throat    Negative Strep screen. Continue to stay well-hydrated. Gargle warm salt water and spit it out. Continued to alternate between Tylenol and ibuprofen for pain. May consider over-the-counter Benadryl for additional relief. Followup with your primary care doctor in 5-7 days for recheck of ongoing symptoms that return to emergency department for emergent changing or worsening of symptoms.   Filed Vitals:   03/22/15 1656  BP: 138/92  Pulse: 87  Temp: 98.4 F (36.9 C)  Resp: [id number] PA-C 03/26/15 1430  Connie Porter, Warner 04/03/15 [phone]

## 2015-03-24 LAB — CULTURE, GROUP A STREP: Strep A Culture: NEGATIVE

## 2015-05-22 ENCOUNTER — Encounter (HOSPITAL_BASED_OUTPATIENT_CLINIC_OR_DEPARTMENT_OTHER): Payer: Self-pay

## 2015-05-22 DIAGNOSIS — Z79899 Other long term (current) drug therapy: Secondary | ICD-10-CM | POA: Insufficient documentation

## 2015-05-22 DIAGNOSIS — J069 Acute upper respiratory infection, unspecified: Secondary | ICD-10-CM | POA: Diagnosis not present

## 2015-05-22 DIAGNOSIS — J3489 Other specified disorders of nose and nasal sinuses: Secondary | ICD-10-CM | POA: Diagnosis present

## 2015-05-22 DIAGNOSIS — I10 Essential (primary) hypertension: Secondary | ICD-10-CM | POA: Insufficient documentation

## 2015-05-22 DIAGNOSIS — Z862 Personal history of diseases of the blood and blood-forming organs and certain disorders involving the immune mechanism: Secondary | ICD-10-CM | POA: Insufficient documentation

## 2015-05-22 NOTE — ED Notes (Signed)
Chills, body aches, nonprod cough, sneezing x 3 days-NAD-steady gait

## 2015-05-23 ENCOUNTER — Emergency Department (HOSPITAL_BASED_OUTPATIENT_CLINIC_OR_DEPARTMENT_OTHER)
Admission: EM | Admit: 2015-05-23 | Discharge: 2015-05-23 | Disposition: A | Discharge status: Home / Self Care | Payer: 59 | Attending: Emergency Medicine | Admitting: Emergency Medicine

## 2015-05-23 DIAGNOSIS — B9789 Other viral agents as the cause of diseases classified elsewhere: Secondary | ICD-10-CM

## 2015-05-23 DIAGNOSIS — J988 Other specified respiratory disorders: Secondary | ICD-10-CM

## 2015-05-23 MED ORDER — PSEUDOEPHEDRINE-GUAIFENESIN ER 120-1200 MG PO TB12: 1.0000 | ORAL_TABLET | Freq: Two times a day (BID) | ORAL | Status: DC

## 2015-05-23 MED ORDER — OXYMETAZOLINE HCL 0.05 % NA SOLN
2.0000 | Freq: Two times a day (BID) | NASAL | Status: DC | PRN
  Administered 2015-05-23: 2 via NASAL
  Filled 2015-05-23: qty 15

## 2015-05-23 NOTE — ED Provider Notes (Signed)
[name]: [id number]     Arrival date & time 05/22/15  2200 History   First MD Initiated Contact with Patient 05/23/15 0159     Chief Complaint  Patient presents with  . flu-like symptoms      (Consider location/radiation/quality/duration/timing/severity/associated sxs/prior Treatment) HPI  This is a 35 year old female with a three-day history of flulike symptoms. Specifically she has had body aches, malaise, nasal congestion, sinus congestion and pressure and nonproductive cough. She denies fever, nausea, vomiting, diarrhea, shortness of breath or wheezing. She has been taking over-the-counter medications without relief. She is most concerned about her sinus pressure.  Past Medical History  Diagnosis Date  . Hypertension   . Anemia    Past Surgical History  Procedure Laterality Date  . Tubal ligation     No family history on file. Social History  Substance Use Topics  . Smoking status: Never Smoker   . Smokeless tobacco: None  . Alcohol Use: No   OB History    No data available     Review of Systems  All other systems reviewed and are negative.   Allergies  Review of patient's allergies indicates no known allergies.  Home Medications   Prior to Admission medications   Medication Sig Start Date End Date Taking? Authorizing Provider  FLUoxetine (PROZAC) 20 MG capsule Take 20 mg by mouth daily.    Historical Provider, MD  imipramine (TOFRANIL) 10 MG tablet Take 10 mg by mouth at bedtime.    Historical Provider, MD  losartan-hydrochlorothiazide (HYZAAR) 100-25 MG tablet Take 1 tablet by mouth daily.    Historical Provider, MD   BP 147/109 mmHg  Pulse 96  Temp(Src) 98.6 F (37 C) (Oral)  Resp 16  Ht  (1.626 m)  Wt 189 lb (85.73 kg)  BMI 32.43 kg/m2  SpO2 97%   Physical Exam  General: Well-developed, well-nourished female in no acute distress; appearance consistent with age of record HENT: normocephalic; atraumatic; nasal congestion; pharynx normal Eyes: pupils  equal, round and reactive to light; extraocular muscles intact Neck: supple Heart: regular rate and rhythm Lungs: clear to auscultation bilaterally Abdomen: soft; nondistended; nontender;  bowel sounds present Extremities: No deformity; full range of motion; pulses normal Neurologic: Awake, alert and oriented; motor function intact in all extremities and symmetric; no facial droop Skin: Warm and dry Psychiatric: Normal mood and affect    ED Course  Procedures (including critical care time)   MDM      Paula Libra, MD 05/23/15 4098

## 2015-05-23 NOTE — Discharge Instructions (Signed)
Viral Infections °A viral infection can be caused by different types of viruses. Most viral infections are not serious and resolve on their own. However, some infections may cause severe symptoms and may lead to further complications. °SYMPTOMS °Viruses can frequently cause: °· Minor sore throat. °· Aches and pains. °· Headaches. °· Runny nose. °· Different types of rashes. °· Watery eyes. °· Tiredness. °· Cough. °· Loss of appetite. °· Gastrointestinal infections, resulting in nausea, vomiting, and diarrhea. °These symptoms do not respond to antibiotics because the infection is not caused by bacteria. However, you might catch a bacterial infection following the viral infection. This is sometimes called a "superinfection." Symptoms of such a bacterial infection may include: °· Worsening sore throat with pus and difficulty swallowing. °· Swollen neck glands. °· Chills and a high or persistent fever. °· Severe headache. °· Tenderness over the sinuses. °· Persistent overall ill feeling (malaise), muscle aches, and tiredness (fatigue). °· Persistent cough. °· Yellow, green, or brown mucus production with coughing. °HOME CARE INSTRUCTIONS  °· Only take over-the-counter or prescription medicines for pain, discomfort, diarrhea, or fever as directed by your caregiver. °· Drink enough water and fluids to keep your urine clear or pale yellow. Sports drinks can provide valuable electrolytes, sugars, and hydration. °· Get plenty of rest and maintain proper nutrition. Soups and broths with crackers or rice are fine. °SEEK IMMEDIATE MEDICAL CARE IF:  °· You have severe headaches, shortness of breath, chest pain, neck pain, or an unusual rash. °· You have uncontrolled vomiting, diarrhea, or you are unable to keep down fluids. °· You or your child has an oral temperature above 102° F (38.9° C), not controlled by medicine. °· Your baby is older than 3 months with a rectal temperature of 102° F (38.9° C) or higher. °· Your baby is 3  months old or younger with a rectal temperature of 100.4° F (38° C) or higher. °MAKE SURE YOU:  °· Understand these instructions. °· Will watch your condition. °· Will get help right away if you are not doing well or get worse. °  °This information is not intended to replace advice given to you by your health care provider. Make sure you discuss any questions you have with your health care provider. °  °Document Released: 12/25/2004 Document Revised: 06/09/2011 Document Reviewed: 08/23/2014 °[employer] Interactive Patient Education ©2016 [employer] ° °

## 2015-06-06 ENCOUNTER — Encounter (HOSPITAL_BASED_OUTPATIENT_CLINIC_OR_DEPARTMENT_OTHER): Payer: Self-pay | Admitting: Emergency Medicine

## 2015-06-06 ENCOUNTER — Emergency Department (HOSPITAL_BASED_OUTPATIENT_CLINIC_OR_DEPARTMENT_OTHER): Payer: 59

## 2015-06-06 ENCOUNTER — Emergency Department (HOSPITAL_BASED_OUTPATIENT_CLINIC_OR_DEPARTMENT_OTHER)
Admission: EM | Admit: 2015-06-06 | Discharge: 2015-06-06 | Disposition: A | Discharge status: Home / Self Care | Payer: 59 | Attending: Emergency Medicine | Admitting: Emergency Medicine

## 2015-06-06 DIAGNOSIS — Y998 Other external cause status: Secondary | ICD-10-CM | POA: Diagnosis not present

## 2015-06-06 DIAGNOSIS — Y92410 Unspecified street and highway as the place of occurrence of the external cause: Secondary | ICD-10-CM | POA: Diagnosis not present

## 2015-06-06 DIAGNOSIS — S3992XA Unspecified injury of lower back, initial encounter: Secondary | ICD-10-CM | POA: Diagnosis not present

## 2015-06-06 DIAGNOSIS — Z79899 Other long term (current) drug therapy: Secondary | ICD-10-CM | POA: Diagnosis not present

## 2015-06-06 DIAGNOSIS — S29012A Strain of muscle and tendon of back wall of thorax, initial encounter: Secondary | ICD-10-CM | POA: Diagnosis not present

## 2015-06-06 DIAGNOSIS — S29019A Strain of muscle and tendon of unspecified wall of thorax, initial encounter: Secondary | ICD-10-CM

## 2015-06-06 DIAGNOSIS — S161XXA Strain of muscle, fascia and tendon at neck level, initial encounter: Secondary | ICD-10-CM | POA: Insufficient documentation

## 2015-06-06 DIAGNOSIS — Z862 Personal history of diseases of the blood and blood-forming organs and certain disorders involving the immune mechanism: Secondary | ICD-10-CM | POA: Diagnosis not present

## 2015-06-06 DIAGNOSIS — Y9389 Activity, other specified: Secondary | ICD-10-CM | POA: Insufficient documentation

## 2015-06-06 DIAGNOSIS — S199XXA Unspecified injury of neck, initial encounter: Secondary | ICD-10-CM | POA: Diagnosis present

## 2015-06-06 DIAGNOSIS — I10 Essential (primary) hypertension: Secondary | ICD-10-CM | POA: Insufficient documentation

## 2015-06-06 MED ORDER — IBUPROFEN 600 MG PO TABS: 600.0000 mg | ORAL_TABLET | Freq: Three times a day (TID) | ORAL | Status: DC

## 2015-06-06 MED ORDER — KETOROLAC TROMETHAMINE 60 MG/2ML IM SOLN
60.0000 mg | Freq: Once | INTRAMUSCULAR | Status: AC
  Administered 2015-06-06: 60 mg via INTRAMUSCULAR
  Filled 2015-06-06: qty 2

## 2015-06-06 MED ORDER — METHOCARBAMOL 500 MG PO TABS: 1000.0000 mg | ORAL_TABLET | Freq: Three times a day (TID) | ORAL | Status: DC | PRN

## 2015-06-06 MED ORDER — METHOCARBAMOL 500 MG PO TABS
1000.0000 mg | ORAL_TABLET | Freq: Once | ORAL | Status: AC
  Administered 2015-06-06: 1000 mg via ORAL
  Filled 2015-06-06: qty 2

## 2015-06-06 MED FILL — IBUPROFEN 600 MG TABLET: 600 | 10 days supply | Qty: 30 | Fill #0

## 2015-06-06 MED FILL — METHOCARBAMOL 500 MG TABLET: 500 | 5 days supply | Qty: 30 | Fill #0
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
## 2015-06-06 NOTE — ED Notes (Signed)
MD at bedside. 

## 2015-06-06 NOTE — Discharge Instructions (Signed)
Cervical Sprain °A cervical sprain is an injury in the neck in which the strong, fibrous tissues (ligaments) that connect your neck bones stretch or tear. Cervical sprains can range from mild to severe. Severe cervical sprains can cause the neck vertebrae to be unstable. This can lead to damage of the spinal cord and can result in serious nervous system problems. The amount of time it takes for a cervical sprain to get better depends on the cause and extent of the injury. Most cervical sprains heal in 1 to 3 weeks. °CAUSES  °Severe cervical sprains may be caused by:  °· Contact sport injuries (such as from football, rugby, wrestling, hockey, auto racing, gymnastics, diving, martial arts, or boxing).   °· Motor vehicle collisions.   °· Whiplash injuries. This is an injury from a sudden forward and backward whipping movement of the head and neck.  °· Falls.   °Mild cervical sprains may be caused by:  °· Being in an awkward position, such as while cradling a telephone between your ear and shoulder.   °· Sitting in a chair that does not offer proper support.   °· Working at a poorly designed computer station.   °· Looking up or down for long periods of time.   °SYMPTOMS  °· Pain, soreness, stiffness, or a burning sensation in the front, back, or sides of the neck. This discomfort may develop immediately after the injury or slowly, 24 hours or more after the injury.   °· Pain or tenderness directly in the middle of the back of the neck.   °· Shoulder or upper back pain.   °· Limited ability to move the neck.   °· Headache.   °· Dizziness.   °· Weakness, numbness, or tingling in the hands or arms.   °· Muscle spasms.   °· Difficulty swallowing or chewing.   °· Tenderness and swelling of the neck.   °DIAGNOSIS  °Most of the time your health care provider can diagnose a cervical sprain by taking your history and doing a physical exam. Your health care provider will ask about previous neck injuries and any known neck  problems, such as arthritis in the neck. X-rays may be taken to find out if there are any other problems, such as with the bones of the neck. Other tests, such as a CT scan or MRI, may also be needed.  °TREATMENT  °Treatment depends on the severity of the cervical sprain. Mild sprains can be treated with rest, keeping the neck in place (immobilization), and pain medicines. Severe cervical sprains are immediately immobilized. Further treatment is done to help with pain, muscle spasms, and other symptoms and may include: °· Medicines, such as pain relievers, numbing medicines, or muscle relaxants.   °· Physical therapy. This may involve stretching exercises, strengthening exercises, and posture training. Exercises and improved posture can help stabilize the neck, strengthen muscles, and help stop symptoms from returning.   °HOME CARE INSTRUCTIONS  °· Put ice on the injured area.   °¨ Put ice in a plastic bag.   °¨ Place a towel between your skin and the bag.   °¨ Leave the ice on for 15-20 minutes, 3-4 times a day.   °· If your injury was severe, you may have been given a cervical collar to wear. A cervical collar is a two-piece collar designed to keep your neck from moving while it heals. °¨ Do not remove the collar unless instructed by your health care provider. °¨ If you have long hair, keep it outside of the collar. °¨ Ask your health care provider before making any adjustments to your collar. Minor   adjustments may be required over time to improve comfort and reduce pressure on your chin or on the back of your head. °¨ If you are allowed to remove the collar for cleaning or bathing, follow your health care provider's instructions on how to do so safely. °¨ Keep your collar clean by wiping it with mild soap and water and drying it completely. If the collar you have been given includes removable pads, remove them every 1-2 days and hand wash them with soap and water. Allow them to air dry. They should be completely  dry before you wear them in the collar. °¨ If you are allowed to remove the collar for cleaning and bathing, wash and dry the skin of your neck. Check your skin for irritation or sores. If you see any, tell your health care provider. °¨ Do not drive while wearing the collar.   °· Only take over-the-counter or prescription medicines for pain, discomfort, or fever as directed by your health care provider.   °· Keep all follow-up appointments as directed by your health care provider.   °· Keep all physical therapy appointments as directed by your health care provider.   °· Make any needed adjustments to your workstation to promote good posture.   °· Avoid positions and activities that make your symptoms worse.   °· Warm up and stretch before being active to help prevent problems.   °SEEK MEDICAL CARE IF:  °· Your pain is not controlled with medicine.   °· You are unable to decrease your pain medicine over time as planned.   °· Your activity level is not improving as expected.   °SEEK IMMEDIATE MEDICAL CARE IF:  °· You develop any bleeding. °· You develop stomach upset. °· You have signs of an allergic reaction to your medicine.   °· Your symptoms get worse.   °· You develop new, unexplained symptoms.   °· You have numbness, tingling, weakness, or paralysis in any part of your body.   °MAKE SURE YOU:  °· Understand these instructions. °· Will watch your condition. °· Will get help right away if you are not doing well or get worse. °  °This information is not intended to replace advice given to you by your health care provider. Make sure you discuss any questions you have with your health care provider. °  °Document Released: 01/12/2007 Document Revised: 03/22/2013 Document Reviewed: 09/22/2012 °Elsevier Interactive Patient Education ©2016 [employer] ° °Muscle Strain °A muscle strain is an injury that occurs when a muscle is stretched beyond its normal length. Usually a small number of muscle fibers are torn when this  happens. Muscle strain is rated in degrees. First-degree strains have the least amount of muscle fiber tearing and pain. Second-degree and third-degree strains have increasingly more tearing and pain.  °Usually, recovery from muscle strain takes 1-2 weeks. Complete healing takes 5-6 weeks.  °CAUSES  °Muscle strain happens when a sudden, violent force placed on a muscle stretches it too far. This may occur with lifting, sports, or a fall.  °RISK FACTORS °Muscle strain is especially common in athletes.  °SIGNS AND SYMPTOMS °At the site of the muscle strain, there may be: °· Pain. °· Bruising. °· Swelling. °· Difficulty using the muscle due to pain or lack of normal function. °DIAGNOSIS  °Your health care provider will perform a physical exam and ask about your medical history. °TREATMENT  °Often, the best treatment for a muscle strain is resting, icing, and applying cold compresses to the injured area.   °HOME CARE INSTRUCTIONS  °· Use the PRICE method of   treatment to promote muscle healing during the first 2-3 days after your injury. The PRICE method involves: °¨ Protecting the muscle from being injured again. °¨ Restricting your activity and resting the injured body part. °¨ Icing your injury. To do this, put ice in a plastic bag. Place a towel between your skin and the bag. Then, apply the ice and leave it on from 15-20 minutes each hour. After the third day, switch to moist heat packs. °¨ Apply compression to the injured area with a splint or elastic bandage. Be careful not to wrap it too tightly. This may interfere with blood circulation or increase swelling. °¨ Elevate the injured body part above the level of your heart as often as you can. °· Only take over-the-counter or prescription medicines for pain, discomfort, or fever as directed by your health care provider. °· Warming up prior to exercise helps to prevent future muscle strains. °SEEK MEDICAL CARE IF:  °· You have increasing pain or swelling in the  injured area. °· You have numbness, tingling, or a significant loss of strength in the injured area. °MAKE SURE YOU:  °· Understand these instructions. °· Will watch your condition. °· Will get help right away if you are not doing well or get worse. °  °This information is not intended to replace advice given to you by your health care provider. Make sure you discuss any questions you have with your health care provider. °  °Document Released: 03/17/2005 Document Revised: 01/05/2013 Document Reviewed: 10/14/2012 °Elsevier Interactive Patient Education ©2016 [employer]vier Inc. ° °
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
## 2016-09-05 ENCOUNTER — Ambulatory Visit: Payer: Managed Care, Other (non HMO) | Admitting: Neurology

## 2019-12-02 ENCOUNTER — Ambulatory Visit: Payer: Managed Care, Other (non HMO) | Admitting: Neurology

## 2020-02-17 NOTE — Progress Notes (Signed)
NEUROLOGY CONSULTATION NOTE  AMRITA RADU MRN: [id number] DOB: 1980/05/14  Referring provider: Montey Hora, MD Primary care provider: Montey Hora, MD  Reason for consult:  papilledema   Subjective:  Connie Warner is a 39 year old right-handed female with HTN who presents for bilateral papilledema.  History supplemented by optometry note.  She has had headaches for about 3 years.  They are usually severe right sided pounding headaches.  Certain positions of head aggravate it (such as laying on side of headache.  May have unilateral neck pain as well into the shoulder.  Sometimes associated with nausea, photophobia and phonophobia.  They typically last several hours but sometimes 3 days.  They usually occur every month.  They may wake her up at night out of sleep.  BC powder usually effective but other OTC analgesics minimally effective.  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  No blurred vision or visual obscurations.  No tinnitus or pulsatile tinnitus.  She saw optometry, Dr. Herschel Senegal, on 11/28/2019 whose exam demonstrated mild bilateral papilledema.  Last eye exam before then was about 3 years ago where she also demonstrated papilledema but never followed up with neurology.  She reports some weight gain in last 3 years. She is not on OCP.   Current medications:  valstartan-HCTZ, sertraline 50mg   PAST MEDICAL HISTORY: Past Medical History:  Diagnosis Date  . Anemia   . Hypertension     PAST SURGICAL HISTORY: Past Surgical History:  Procedure Laterality Date  . TUBAL LIGATION      MEDICATIONS: Current Outpatient Medications on File Prior to Visit  Medication Sig Dispense Refill  . FLUoxetine (PROZAC) 20 MG capsule Take 20 mg by mouth daily.    ibuprofen (ADVIL,MOTRIN) 600 MG tablet Take 1 tablet (600 mg total) by mouth 3 (three) times daily after meals. 30 tablet 0  . imipramine (TOFRANIL) 10 MG tablet Take 10 mg by mouth at  bedtime.    Marland Kitchen losartan-hydrochlorothiazide (HYZAAR) 100-25 MG tablet Take 1 tablet by mouth daily.    . methocarbamol (ROBAXIN) 500 MG tablet Take 2 tablets (1,000 mg total) by mouth every 8 (eight) hours as needed for muscle spasms. 30 tablet 0  . Pseudoephedrine-Guaifenesin (MUCINEX D) [phone] MG TB12 Take 1 tablet by mouth 2 (two) times daily.     No current facility-administered medications on file prior to visit.    ALLERGIES: No Known Allergies  FAMILY HISTORY: Family History  Problem Relation Age of Onset  . Migraines Mother     SOCIAL HISTORY: Social History   Socioeconomic History  . Marital status: Married    Spouse name: Not on file  . Number of children: Not on file  . Years of education: Not on file  . Highest education level: Not on file  Occupational History  . Not on file  Tobacco Use  . Smoking status: Never Smoker  Substance and Sexual Activity  . Alcohol use: No  . Drug use: No  . Sexual activity: Not on file  Other Topics Concern  . Not on file  Social History Narrative  . Not on file   Social Determinants of Health   Financial Resource Strain:   . Difficulty of Paying Living Expenses: Not on file  Food Insecurity:   . Worried About Marland Kitchen in the Last Year: Not on file  . Ran Out of Food in the Last Year: Not on file  Transportation  Needs:   . Lack of Transportation (Medical): Not on file  . Lack of Transportation (Non-Medical): Not on file  Physical Activity:   . Days of Exercise per Week: Not on file  . Minutes of Exercise per Session: Not on file  Stress:   . Feeling of Stress : Not on file  Social Connections:   . Frequency of Communication with Friends and Family: Not on file  . Frequency of Social Gatherings with Friends and Family: Not on file  . Attends Religious Services: Not on file  . Active Member of Clubs or Organizations: Not on file  . Attends Banker Meetings: Not on file  . Marital Status: Not  on file  Intimate Partner Violence:   . Fear of Current or Ex-Partner: Not on file  . Emotionally Abused: Not on file  . Physically Abused: Not on file  . Sexually Abused: Not on file    Objective:  Blood pressure (!) 149/110, pulse 75, height 5\' 4"  (1.626 m), weight 185 lb 9.6 oz (84.2 kg), SpO2 98 %. General: No acute distress.  Patient appears well-groomed.   Head:  Normocephalic/atraumatic Eyes:  fundi examined but not visualized Neck: supple, no paraspinal tenderness, full range of motion Back: No paraspinal tenderness Heart: regular rate and rhythm Lungs: Clear to auscultation bilaterally. Vascular: No carotid bruits. Neurological Exam: Mental status: alert and oriented to person, place, and time, recent and remote memory intact, fund of knowledge intact, attention and concentration intact, speech fluent and not dysarthric, language intact. Cranial nerves: CN I: not tested CN II: pupils equal, round and reactive to light, visual fields intact CN III, IV, VI:  full range of motion, no nystagmus, no ptosis CN V: facial sensation intact. CN VII: upper and lower face symmetric CN VIII: hearing intact CN IX, X: gag intact, uvula midline CN XI: sternocleidomastoid and trapezius muscles intact CN XII: tongue midline Bulk & Tone: normal, no fasciculations. Motor:  muscle strength 5/5 throughout Sensation:  Pinprick, temperature and vibratory sensation intact. Deep Tendon Reflexes:  2+ throughout,  toes downgoing.   Finger to nose testing:  Without dysmetria.   Heel to shin:  Without dysmetria.   Gait:  Normal station and stride.  Romberg negative.  Assessment/Plan:   Papilledema.  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  Check MRI/MRV of brain 2.  Following MRI, will schedule for LP 3.  If elevated opening pressure confirmed, will start acetazolamide 500mg  twice daily and will have her follow up with her optometrist in 6 weeks. 4.  Follow up 4 to 6  months.    Thank you for allowing me to take part in the care of this patient.  , DO  CC: , MD

## 2020-02-20 ENCOUNTER — Encounter: Payer: Self-pay | Admitting: Neurology

## 2020-02-20 ENCOUNTER — Other Ambulatory Visit: Payer: Self-pay

## 2020-02-20 ENCOUNTER — Ambulatory Visit (INDEPENDENT_AMBULATORY_CARE_PROVIDER_SITE_OTHER): Payer: 59 | Admitting: Neurology

## 2020-02-20 VITALS — BP 149/110 | HR 75 | Ht 64.0 in | Wt 185.6 lb

## 2020-02-20 DIAGNOSIS — H4710 Unspecified papilledema: Secondary | ICD-10-CM

## 2020-02-20 NOTE — Patient Instructions (Addendum)
1.  We will check MRI of brain with and without contrast and MRV of brain. We have sent a referral to [facility] for your MRI and they will call you directly to schedule your appointment. They are located at [address] [facility]. If you need to contact them directly please call [phone].  2.  Following MRI, will likely proceed with a spinal tap to evaluate spinal fluid pressure (also checking cell count, protein, glucose and gram stain/culture).  Will start medication accordingly. 3.  Follow up in 4 to 6 months but we will

## 2020-03-07 ENCOUNTER — Ambulatory Visit
Admission: RE | Admit: 2020-03-07 | Discharge: 2020-03-07 | Disposition: A | Discharge status: Home / Self Care | Payer: 59 | Source: Ambulatory Visit | Attending: Neurology | Admitting: Neurology

## 2020-03-07 ENCOUNTER — Other Ambulatory Visit: Payer: Self-pay

## 2020-03-07 DIAGNOSIS — H4710 Unspecified papilledema: Secondary | ICD-10-CM

## 2020-03-07 MED ORDER — GADOBENATE DIMEGLUMINE 529 MG/ML IV SOLN
17.0000 mL | Freq: Once | INTRAVENOUS | Status: AC | PRN
  Administered 2020-03-07: 17 mL via INTRAVENOUS

## 2020-03-09 ENCOUNTER — Telehealth: Payer: Self-pay

## 2020-03-09 DIAGNOSIS — H4710 Unspecified papilledema: Secondary | ICD-10-CM

## 2020-03-09 NOTE — Progress Notes (Signed)
Pt advised of her MRI and MRV results, and recommendation of a LP do be preformed. Pt advised Lp ordered and sent to [facility] imaging.

## 2020-03-09 NOTE — Telephone Encounter (Signed)
-----   Message from Drema Dallas, DO sent at 03/09/2020  8:14 AM EST ----- MRI and MRV of brain show no tumor or blood clots.  I would like to proceed with lumbar puncture testing for opening pressure, cell count, protein, glucose and culture.

## 2020-03-09 NOTE — Telephone Encounter (Signed)
Tried calling pt, No answer. LMIVM to call back.   LP order added

## 2020-03-10 ENCOUNTER — Other Ambulatory Visit: Payer: 59

## 2020-03-19 ENCOUNTER — Other Ambulatory Visit: Payer: Self-pay

## 2020-03-19 ENCOUNTER — Ambulatory Visit
Admission: RE | Admit: 2020-03-19 | Discharge: 2020-03-19 | Disposition: A | Discharge status: Home / Self Care | Payer: 59 | Source: Ambulatory Visit | Attending: Neurology | Admitting: Neurology

## 2020-03-19 ENCOUNTER — Telehealth: Payer: Self-pay

## 2020-03-19 VITALS — BP 159/115 | HR 74

## 2020-03-19 DIAGNOSIS — H4710 Unspecified papilledema: Secondary | ICD-10-CM

## 2020-03-19 MED ORDER — ACETAZOLAMIDE ER 500 MG PO CP12: 500.0000 mg | ORAL_CAPSULE | Freq: Two times a day (BID) | ORAL | 2 refills | Status: AC

## 2020-03-19 NOTE — Telephone Encounter (Signed)
-----   Message from Drema Dallas, DO sent at 03/19/2020 10:25 AM EST ----- Pressure is elevated.  I would like to start acetazolamide 500mg  twice daily.  Side effect may include numbness and tingling, so she shouldn't be concerned if she experiences this.  This is the best medication to bring down the pressure.  I would like her to follow up with Dr. in 6 weeks and have his evaluation faxed to me.  If still evidence of pressure, would need to increase medication.  Keep follow up with me as scheduled.

## 2020-03-19 NOTE — Telephone Encounter (Signed)
Pt advised of results. And medication sent to pharmacy.

## 2020-03-19 NOTE — Discharge Instructions (Signed)

## 2020-03-23 LAB — CSF CELL COUNT WITH DIFFERENTIAL
RBC Count, CSF: 0 cells/uL
WBC, CSF: 1 cells/uL (ref 0–5)

## 2020-03-23 LAB — CSF CULTURE W GRAM STAIN
MICRO NUMBER:: 11337579
Result:: NO GROWTH
SPECIMEN QUALITY:: ADEQUATE

## 2020-03-23 LAB — PROTEIN, CSF: Total Protein, CSF: 26 mg/dL (ref 15–45)

## 2020-03-23 LAB — GLUCOSE, CSF: Glucose, CSF: 56 mg/dL (ref 40–80)

## 2020-04-13 ENCOUNTER — Encounter (HOSPITAL_COMMUNITY): Payer: Self-pay | Admitting: *Deleted

## 2020-04-13 ENCOUNTER — Other Ambulatory Visit: Payer: Self-pay

## 2020-04-13 ENCOUNTER — Emergency Department (HOSPITAL_COMMUNITY): Payer: 59

## 2020-04-13 ENCOUNTER — Emergency Department (HOSPITAL_COMMUNITY)
Admission: EM | Admit: 2020-04-13 | Discharge: 2020-04-13 | Disposition: A | Discharge status: Home / Self Care | Payer: 59 | Attending: Emergency Medicine | Admitting: Emergency Medicine

## 2020-04-13 DIAGNOSIS — R519 Headache, unspecified: Secondary | ICD-10-CM | POA: Diagnosis present

## 2020-04-13 DIAGNOSIS — Z79899 Other long term (current) drug therapy: Secondary | ICD-10-CM | POA: Diagnosis not present

## 2020-04-13 DIAGNOSIS — I10 Essential (primary) hypertension: Secondary | ICD-10-CM | POA: Insufficient documentation

## 2020-04-13 DIAGNOSIS — R590 Localized enlarged lymph nodes: Secondary | ICD-10-CM | POA: Diagnosis not present

## 2020-04-13 DIAGNOSIS — B349 Viral infection, unspecified: Secondary | ICD-10-CM | POA: Diagnosis not present

## 2020-04-13 MED ORDER — AEROCHAMBER PLUS FLO-VU MEDIUM MISC
1.0000 | Freq: Once | Status: AC
  Administered 2020-04-13: 1
  Filled 2020-04-13 (×2): qty 1

## 2020-04-13 MED ORDER — IBUPROFEN 200 MG PO TABS
600.0000 mg | ORAL_TABLET | Freq: Once | ORAL | Status: AC
  Administered 2020-04-13: 600 mg via ORAL
  Filled 2020-04-13: qty 3

## 2020-04-13 MED ORDER — ALBUTEROL SULFATE HFA 108 (90 BASE) MCG/ACT IN AERS
2.0000 | INHALATION_SPRAY | Freq: Once | RESPIRATORY_TRACT | Status: AC
  Administered 2020-04-13: 2 via RESPIRATORY_TRACT
  Filled 2020-04-13: qty 6.7

## 2020-04-13 MED ORDER — BENZONATATE 100 MG PO CAPS: 100.0000 mg | ORAL_CAPSULE | Freq: Three times a day (TID) | ORAL | 0 refills | Status: AC | PRN

## 2020-04-13 NOTE — Discharge Instructions (Addendum)
You were evaluated today in the emergency department for your sore throat, fever, cough.  You have a COVID-19 test result pending.  Your physical exam and vital signs were very reassuring, as was your chest x-ray.  There is no sign of infection in your lungs at this time.  You may continue to utilize Tylenol and ibuprofen as needed at home for your symptoms.  You have been prescribed a medication called Tessalon for your cough at home.  Recommend you increase your hydration and rest.  You may utilize over-the-counter remedies for your throat pain such as numbing throat spray, throat lozenges, hot water with honey.  Follow-up closely with your primary care doctor.  Return to the emergency department if develop any chest pain, difficulty breathing, nausea or vomiting that does not stop, or any other new severe symptoms.

## 2020-04-13 NOTE — ED Triage Notes (Signed)
Pt presents with headache, cough, fever, tested for covid yesterday and does not have results.  She has been taking OTC meds

## 2020-04-13 NOTE — ED Provider Notes (Signed)
[facility]-EMERGENCY DEPT Provider Note   [name]: [id number] Arrival date & time: 04/13/20  1311     History Chief Complaint  Patient presents with  . Headache  . Fever  . Generalized Body Aches  . Cough    Connie Warner is a 40 y.o. female who presents with concern for 3 days of headache, cough, fever.  She was tested for COVID yesterday, but presented today due to severe throat pain.  Patient was vaccinated against COVID-19 x 2 doses, did not receive a booster.  Has been taking Tylenol and ibuprofen as needed at home for her body aches and for her fever.  Denies any chest pain or palpitations, endorses mild shortness of breath.  Denies abdominal pain, nausea, vomiting, diarrhea.  Endorses myalgias.  She endorses hydration, but states her throat has been hurting too severely to take her daily blood pressure medication or to swallow solid foods.  Denies recent travel, immobilization, procedures, history of malignancy or blood clot, denies hormone replacement therapy.  Patient is tearful at time of initial exam due to throat pain, anxiety.  I personally reviewed this patient's medical records. She has history of hypertension, anemia.  HPI     Past Medical History:  Diagnosis Date  . Anemia   . Hypertension     There are no problems to display for this patient.   Past Surgical History:  Procedure Laterality Date  . TUBAL LIGATION       OB History   No obstetric history on file.     Family History  Problem Relation Age of Onset  . Migraines Mother     Social History   Tobacco Use  . Smoking status: Never Smoker  . Smokeless tobacco: Never Used  Substance Use Topics  . Alcohol use: No  . Drug use: No    Home Medications Prior to Admission medications   Medication Sig Start Date End Date Taking? Authorizing Provider  benzonatate (TESSALON) 100 MG capsule Take 1 capsule (100 mg total) by mouth every 8 (eight) hours as needed for  cough. 04/13/20  Yes Jeraline Marcinek R, PA-C  acetaZOLAMIDE (DIAMOX) 500 MG capsule Take 1 capsule (500 mg total) by mouth 2 (two) times daily. 03/19/20   Drema Dallas, DO  FLUoxetine (PROZAC) 20 MG capsule Take 20 mg by mouth daily. Patient not taking: Reported on 02/20/2020    [provider]  ibuprofen (ADVIL,MOTRIN) 600 MG tablet Take 1 tablet (600 mg total) by mouth 3 (three) times daily after meals. 06/06/15   Loren Racer, MD  imipramine (TOFRANIL) 10 MG tablet Take 10 mg by mouth at bedtime. Patient not taking: Reported on 02/20/2020    [provider]  losartan-hydrochlorothiazide (HYZAAR) 100-25 MG tablet Take 1 tablet by mouth daily. Patient not taking: Reported on 02/20/2020    [provider]  methocarbamol (ROBAXIN) 500 MG tablet Take 2 tablets (1,000 mg total) by mouth every 8 (eight) hours as needed for muscle spasms. Patient not taking: Reported on 02/20/2020 06/06/15   Loren Racer, MD  Pseudoephedrine-Guaifenesin [facility] D) [phone] MG TB12 Take 1 tablet by mouth 2 (two) times daily. Patient not taking: Reported on 02/20/2020 05/23/15   Molpus, John, MD  sertraline (ZOLOFT) 50 MG tablet Take 50 mg by mouth as needed. 01/03/20   [provider]  valsartan-hydrochlorothiazide (DIOVAN-HCT) 80-12.5 MG tablet Take 1 tablet by mouth every morning. 11/30/19   [provider]    Allergies    Patient has  no known allergies.  Review of Systems   Review of Systems  Constitutional: Positive for activity change, appetite change, chills, fatigue and fever.  HENT: Positive for congestion and sore throat. Negative for trouble swallowing and voice change.   Eyes: Negative.   Respiratory: Positive for chest tightness and shortness of breath. Negative for cough and wheezing.   Cardiovascular: Negative for chest pain, palpitations and leg swelling.  Gastrointestinal: Negative for abdominal pain, diarrhea, nausea and vomiting.   Genitourinary: Negative.   Musculoskeletal: Positive for myalgias.  Skin: Negative.   Neurological: Positive for headaches. Negative for dizziness, syncope, facial asymmetry and weakness.  Hematological: Negative.     Physical Exam Updated Vital Signs BP (!) 153/128 (BP Location: Right Arm) Comment: Patient stated she did not take her blood pressure medicine this morning.  Pulse (!) 118   Temp 99 F (37.2 C) (Oral)   Resp 18   Ht 5\' 4"  (1.626 m)   Wt 83.9 kg   SpO2 95%   BMI 31.76 kg/m   Physical Exam Vitals and nursing note reviewed.  Constitutional:      Appearance: Normal appearance.  HENT:     Head: Normocephalic and atraumatic.     Nose: Nose normal.     Mouth/Throat:     Mouth: Mucous membranes are moist.     Pharynx: Oropharynx is clear. Uvula midline. Posterior oropharyngeal erythema present. No pharyngeal swelling, oropharyngeal exudate or uvula swelling.     Tonsils: No tonsillar exudate. 0 on the right. 0 on the left.  Eyes:     General: Lids are normal.        Right eye: No discharge.        Left eye: No discharge.     Extraocular Movements: Extraocular movements intact.     Conjunctiva/sclera: Conjunctivae normal.     Pupils: Pupils are equal, round, and reactive to light.  Neck:     Trachea: Trachea and phonation normal.  Cardiovascular:     Rate and Rhythm: Normal rate and regular rhythm.     Pulses: Normal pulses.          Radial pulses are 2+ on the right side and 2+ on the left side.       Dorsalis pedis pulses are 2+ on the right side and 2+ on the left side.     Heart sounds: Normal heart sounds. No murmur heard.   Pulmonary:     Effort: Pulmonary effort is normal. No accessory muscle usage, prolonged expiration, respiratory distress or retractions.     Breath sounds: Decreased air movement present. Examination of the right-lower field reveals decreased breath sounds and wheezing. Examination of the left-lower field reveals decreased breath  sounds. Decreased breath sounds and wheezing present. No rales.  Chest:     Chest wall: No lacerations, deformity, swelling, tenderness, crepitus or edema.  Abdominal:     General: Bowel sounds are normal. There is no distension.     Palpations: Abdomen is soft.     Tenderness: There is no abdominal tenderness. There is no right CVA tenderness, left CVA tenderness, guarding or rebound.  Musculoskeletal:        General: No swelling, tenderness or deformity.     Cervical back: Neck supple. No rigidity or crepitus. No pain with movement, spinous process tenderness or muscular tenderness.     Right lower leg: No edema.     Left lower leg: No edema.  Lymphadenopathy:     Cervical: Cervical adenopathy present.  Right cervical: Superficial cervical adenopathy present.     Left cervical: Superficial cervical adenopathy present.  Skin:    General: Skin is warm and dry.     Capillary Refill: Capillary refill takes less than 2 seconds.  Neurological:     Mental Status: She is alert. Mental status is at baseline.     Sensory: Sensation is intact.     Motor: Motor function is intact.     Gait: Gait is intact.  Psychiatric:        Mood and Affect: Mood normal. Affect is tearful.     ED Results / Procedures / Treatments   Labs (all labs ordered are listed, but only abnormal results are displayed) Labs Reviewed - No data to display  EKG None  Radiology DG Chest Portable 1 View  Result Date: 04/13/2020 CLINICAL DATA:  Shortness of breath. EXAM: PORTABLE CHEST 1 VIEW COMPARISON:  None. FINDINGS: The heart size and mediastinal contours are within normal limits. Both lungs are clear. The visualized skeletal structures are unremarkable. IMPRESSION: No active disease. Electronically Signed   By: Signa Kell M.D.   On: 04/13/2020 15:29    Procedures Procedures (including critical care time)  Medications Ordered in ED Medications  albuterol (VENTOLIN HFA) 108 (90 Base) MCG/ACT inhaler 2  puff (2 puffs Inhalation Given 04/13/20 1553)  AeroChamber Plus Flo-Vu Medium MISC 1 each (1 each Other Given 04/13/20 1558)  ibuprofen (ADVIL) tablet 600 mg (600 mg Oral Given 04/13/20 1553)    ED Course  I have reviewed the triage vital signs and the nursing notes.  Pertinent labs & imaging results that were available during my care of the patient were reviewed by me and considered in my medical decision making (see chart for details).    MDM Rules/Calculators/A&P                         40 year old female presents with concern for worsening chest tightness and throat pain x3 days.  Patient has COVID-19 test pending, supposed to result today. COVID vaccinated x 2.   Patient hypertensive on intake to 153/128, has not been taking her hypertension medication due to throat pain.  Tachycardic on intake to 118.  At time of my physical exam patient is no longer tachycardic, with heart rate of 92.  Pulmonary exam revealed mild decreased breath sounds in the lung bases bilaterally with wheezing in the left lung base.  Abdominal exam is benign.  Mild posterior pharyngeal erythema without tonsillar edema or exudate.  Shotty anterior cervical lymphadenopathy.  We will proceed with albuterol administration and chest x-ray.  Motrin ordered. Patient is PERC negative at this time, no indication for laboratory studies.  Chest x-ray negative for acute cardiopulmonary disease.  Basilar wheezing improved on auscultation after administration of albuterol. Patient endorses mild improvement in chest tightness after administration of albuterol. Given reassuring physical exam, vital signs, chest x-ray, no further work-up is warranted in the emergency department at this time.  Recommend patient follow for COVID-19 test from outside testing site.  She may continue to take Tylenol or ibuprofen as needed at home for symptoms.  May utilize albuterol inhaler as needed at home for chest tightness.  Recommend close follow-up with  her primary care doctor.  Nanami voiced understanding of her medical evaluation and treatment plan.  Each of her questions were answered to her expressed satisfaction. Return precautions were given.  Patient is stable and appropriate for discharge at this time.  Connie Warner was evaluated in Emergency Department on 04/13/2020 for the symptoms described in the history of present illness. She was evaluated in the context of the global COVID-19 pandemic, which necessitated consideration that the patient might be at risk for infection with the SARS-CoV-2 virus that causes COVID-19. Institutional protocols and algorithms that pertain to the evaluation of patients at risk for COVID-19 are in a state of rapid change based on information released by regulatory bodies including the CDC and federal and state organizations. These policies and algorithms were followed during the patient's care in the ED.  This chart was dictated using voice recognition software, Dragon. Despite the best efforts of this provider to proofread and correct errors, errors may still occur which can change documentation meaning.   Final Clinical Impression(s) / ED Diagnoses Final diagnoses:  Viral syndrome    Rx / DC Orders ED Discharge Orders         Ordered    benzonatate (TESSALON) 100 MG capsule  Every 8 hours PRN        04/13/20 1504           Jhamari Markowicz, Eugene GaviaRebekah R, PA-C 04/13/20 1625    Vanetta MuldersZackowski, Scott, MD 04/19/20 1754

## 2020-04-14 ENCOUNTER — Encounter (HOSPITAL_BASED_OUTPATIENT_CLINIC_OR_DEPARTMENT_OTHER): Payer: Self-pay | Admitting: *Deleted

## 2020-04-14 ENCOUNTER — Emergency Department (HOSPITAL_BASED_OUTPATIENT_CLINIC_OR_DEPARTMENT_OTHER)
Admission: EM | Admit: 2020-04-14 | Discharge: 2020-04-15 | Disposition: A | Discharge status: Home / Self Care | Payer: 59 | Attending: Emergency Medicine | Admitting: Emergency Medicine

## 2020-04-14 ENCOUNTER — Other Ambulatory Visit: Payer: Self-pay

## 2020-04-14 DIAGNOSIS — J029 Acute pharyngitis, unspecified: Secondary | ICD-10-CM

## 2020-04-14 DIAGNOSIS — Z20822 Contact with and (suspected) exposure to covid-19: Secondary | ICD-10-CM | POA: Insufficient documentation

## 2020-04-14 DIAGNOSIS — I10 Essential (primary) hypertension: Secondary | ICD-10-CM | POA: Diagnosis not present

## 2020-04-14 DIAGNOSIS — Z79899 Other long term (current) drug therapy: Secondary | ICD-10-CM | POA: Insufficient documentation

## 2020-04-14 DIAGNOSIS — R070 Pain in throat: Secondary | ICD-10-CM | POA: Diagnosis present

## 2020-04-14 NOTE — ED Triage Notes (Signed)
Pt. Reports sore throat x 3 days with a little bit of a cough that started 2 days ago, dry cough per Pt.  Pt. Reports she was COVID tested on Wednesday at [facility] with no results as of yet.  Pt. Was with her daughter who is Covid positive and lives with her.

## 2020-04-15 LAB — GROUP A STREP BY PCR: Group A Strep by PCR: NOT DETECTED

## 2020-04-15 MED ORDER — HYDROCODONE-ACETAMINOPHEN 7.5-325 MG/15ML PO SOLN: 10.0000 mL | Freq: Four times a day (QID) | ORAL | 0 refills | Status: AC | PRN

## 2020-04-15 MED ORDER — DEXAMETHASONE SODIUM PHOSPHATE 10 MG/ML IJ SOLN
10.0000 mg | Freq: Once | INTRAMUSCULAR | Status: AC
  Administered 2020-04-15: 10 mg via INTRAMUSCULAR
  Filled 2020-04-15: qty 1

## 2020-04-15 NOTE — ED Provider Notes (Signed)
[facility] Provider Note: Lowella Dell, MD, FACEP  [name]: [id number] MRN: [id number] ARRIVAL: 04/14/20 at 2254 ROOM: MH03/[facility]03   CHIEF COMPLAINT  Sore Throat   HISTORY OF PRESENT ILLNESS  04/15/20 1:12 AM Connie Warner is a 40 y.o. female who was seen in the [facility] ED 04/13/2020 for sore throat with headache, fever, generalized body aches and cough.  A COVID test to been performed the day before but had not yet resulted.  She has had 2 vaccines but no booster.  Her throat was noted to be erythematous with cervical lymphadenopathy.  She also had some wheezing.  She was diagnosed with a viral syndrome and was treated with ibuprofen and albuterol.  She returns with a sore throat which she rates as a 10 out of 10, worse with swallowing.  She continues to have nasal congestion, low-grade fever and occasional cough.  Her COVID test still has not resulted but she has had exposure to her daughter who is COVID-positive.  Past Medical History:  Diagnosis Date  . Anemia   . Hypertension     Past Surgical History:  Procedure Laterality Date  . TUBAL LIGATION      Family History  Problem Relation Age of Onset  . Migraines Mother     Social History   Tobacco Use  . Smoking status: Never Smoker  . Smokeless tobacco: Never Used  Substance Use Topics  . Alcohol use: No  . Drug use: No    Prior to Admission medications   Medication Sig Start Date End Date Taking? Authorizing Provider  HYDROcodone-acetaminophen (HYCET) 7.5-325 mg/15 ml solution Take 10 mLs by mouth every 6 (six) hours as needed (for pain). 04/15/20  Yes Lonzo Saulter, MD  acetaZOLAMIDE (DIAMOX) 500 MG capsule Take 1 capsule (500 mg total) by mouth 2 (two) times daily. 03/19/20   Drema Dallas, DO  benzonatate (TESSALON) 100 MG capsule Take 1 capsule (100 mg total) by mouth every 8 (eight) hours as needed for cough. 04/13/20   Sponseller, Lupe Carney R, PA-C  sertraline (ZOLOFT) 50 MG tablet Take 50 mg by  mouth as needed. 01/03/20   [provider]  valsartan-hydrochlorothiazide (DIOVAN-HCT) 80-12.5 MG tablet Take 1 tablet by mouth every morning. 11/30/19   [provider]  FLUoxetine (PROZAC) 20 MG capsule Take 20 mg by mouth daily. Patient not taking: Reported on 02/20/2020  04/15/20  [provider]  losartan-hydrochlorothiazide (HYZAAR) 100-25 MG tablet Take 1 tablet by mouth daily. Patient not taking: Reported on 02/20/2020  04/15/20  [provider]    Allergies Patient has no known allergies.   REVIEW OF SYSTEMS  Negative except as noted here or in the History of Present Illness.   PHYSICAL EXAMINATION  Initial Vital Signs Blood pressure (!) 139/108, pulse (!) 110, temperature 98.6 F (37 C), temperature source Oral, resp. rate 20, height 5\' 4"  (1.626 m), weight 83.9 kg, SpO2 98 %.  Examination General: Well-developed, well-nourished female in no acute distress; appearance consistent with age of record HENT: normocephalic; atraumatic; pharyngeal erythema without exudate Eyes: Normal appearing intact Neck: supple; mild lymphadenopathy Heart: regular rate and rhythm Lungs: clear to auscultation bilaterally Abdomen: soft; nondistended; nontender; bowel sounds present Extremities: No deformity; full range of motion; pulses normal Neurologic: Awake, alert and oriented; motor function intact in all extremities and symmetric; no facial droop Skin: Warm and dry Psychiatric: Normal mood and affect   RESULTS  Summary of this visit's results, reviewed and interpreted by myself:  EKG Interpretation  Date/Time:    Ventricular Rate:    PR Interval:    QRS Duration:   QT Interval:    QTC Calculation:   R Axis:     Text Interpretation:        Laboratory Studies: Results for orders placed or performed during the hospital encounter of 04/14/20 (from the past 24 hour(s))  Group A Strep by PCR     Status: None   Collection Time: 04/15/20  1:20  AM   Specimen: Throat; Sterile Swab  Result Value Ref Range   Group A Strep by PCR NOT DETECTED NOT DETECTED   Imaging Studies: DG Chest Portable 1 View  Result Date: 04/13/2020 CLINICAL DATA:  Shortness of breath. EXAM: PORTABLE CHEST 1 VIEW COMPARISON:  None. FINDINGS: The heart size and mediastinal contours are within normal limits. Both lungs are clear. The visualized skeletal structures are unremarkable. IMPRESSION: No active disease. Electronically Signed   By: Signa Kell M.D.   On: 04/13/2020 15:29    ED COURSE and MDM  Nursing notes, initial and subsequent vitals signs, including pulse oximetry, reviewed and interpreted by myself.  Vitals:   04/14/20 2303 04/14/20 2307 04/14/20 2308 04/15/20 0122  BP:  (!) 139/108    Pulse:  (!) 110    Resp:  20    Temp: 98.6 F (37 C)     TempSrc: Oral Oral    SpO2:  98%  98%  Weight:   83.9 kg   Height:   5\' 4"  (1.626 m)    Medications  dexamethasone (DECADRON) injection 10 mg (10 mg Intramuscular Given 04/15/20 0206)    Strep test is negative.  I suspect this is likely COVID but her COVID test is pending.  We will give a dose of dexamethasone as this tends to symptomatically improve the discomfort of a sore throat.  PROCEDURES  Procedures   ED DIAGNOSES     ICD-10-CM   1. Sore throat  J02.9   2. Suspected COVID-19 virus infection  Z20.822        Harveer Sadler, 04/17/20, MD 04/15/20 04/17/20

## 2020-08-08 NOTE — Progress Notes (Deleted)
NEUROLOGY FOLLOW UP OFFICE NOTE  Connie Warner [id number]  Assessment/Plan:   ***  Subjective:  Connie Warner is a 40 year old right-handed female with HTN who follows up for idiopathic intracranial hypertension.  UPDATE: Current medications:  acetazolamide 500mg  BID, valstartan-HCTZ, sertraline 50mg   MRI and MRV of brain on 03/07/2020 personally reviewed showed partially empty sella turcica but otherwise unremarkable with no venous sinus thrombosis or venous stenosis.  She underwent lumbar puncture on 03/19/2020 which demonstrated an elevated opening pressure of 35 cm waster and closing pressure of 10 cm water.  She was started on acetazolamide  HISTORY: She has had headaches for about 3 years.  They are usually severe right sided pounding headaches.  Certain positions of head aggravate it (such as laying on side of headache.  May have unilateral neck pain as well into the shoulder.  Sometimes associated with nausea, photophobia and phonophobia.  They typically last several hours but sometimes 3 days.  They usually occur every month.  They may wake her up at night out of sleep.  BC powder usually effective but other OTC analgesics minimally effective.  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  No blurred vision or visual obscurations.  No tinnitus or pulsatile tinnitus.  She saw optometry, Dr. 14/10/2019, on 11/28/2019 whose exam demonstrated mild bilateral papilledema.  Last eye exam before then was about 3 years ago where she also demonstrated papilledema but never followed up with neurology.  She reports some weight gain in last 3 years. She is not on OCP.    PAST MEDICAL HISTORY: Past Medical History:  Diagnosis Date  . Anemia   . Hypertension     MEDICATIONS: Current Outpatient Medications on File Prior to Visit  Medication Sig Dispense Refill  . acetaZOLAMIDE (DIAMOX) 500 MG capsule Take 1 capsule (500 mg total) by mouth 2 (two) times  daily. 60 capsule 2  . benzonatate (TESSALON) 100 MG capsule Take 1 capsule (100 mg total) by mouth every 8 (eight) hours as needed for cough. 21 capsule 0  . HYDROcodone-acetaminophen (HYCET) 7.5-325 mg/15 ml solution Take 10 mLs by mouth every 6 (six) hours as needed (for pain). 120 mL 0  . sertraline (ZOLOFT) 50 MG tablet Take 50 mg by mouth as needed.    . valsartan-hydrochlorothiazide (DIOVAN-HCT) 80-12.5 MG tablet Take 1 tablet by mouth every morning.    . [DISCONTINUED] FLUoxetine (PROZAC) 20 MG capsule Take 20 mg by mouth daily. (Patient not taking: Reported on 02/20/2020)    . [DISCONTINUED] losartan-hydrochlorothiazide (HYZAAR) 100-25 MG tablet Take 1 tablet by mouth daily. (Patient not taking: Reported on 02/20/2020)     No current facility-administered medications on file prior to visit.    ALLERGIES: No Known Allergies  FAMILY HISTORY: Family History  Problem Relation Age of Onset  . Migraines Mother       Objective:  *** General: No acute distress.  Patient appears ***-groomed.   Head:  Normocephalic/atraumatic Eyes:  Fundi examined but not visualized Neck: supple, no paraspinal tenderness, full range of motion Heart:  Regular rate and rhythm Lungs:  Clear to auscultation bilaterally Back: No paraspinal tenderness Neurological Exam: alert and oriented to person, place, and time. Attention span and concentration intact, recent and remote memory intact, fund of knowledge intact.  Speech fluent and not dysarthric, language intact.  CN II-XII intact. Bulk and tone normal, muscle strength 5/5 throughout.  Sensation to light touch, temperature and vibration  intact.  Deep tendon reflexes 2+ throughout, toes downgoing.  Finger to nose and heel to shin testing intact.  Gait normal, Romberg negative.     Shon Millet, DO  CC:  Montey Hora, MD

## 2020-08-09 ENCOUNTER — Ambulatory Visit: Payer: 59 | Admitting: Neurology

## 2021-04-01 IMAGING — XA DG SPINAL PUNCT LUMBAR DIAG WITH FL CT GUIDANCE
1 series · 1 of 1 positions shown · non-contrast
Comparison: none

CLINICAL DATA: Papilledema.  Headache.

[Series 1: ortho adipose · 1 of 1 slices shown]
[im 1/1]
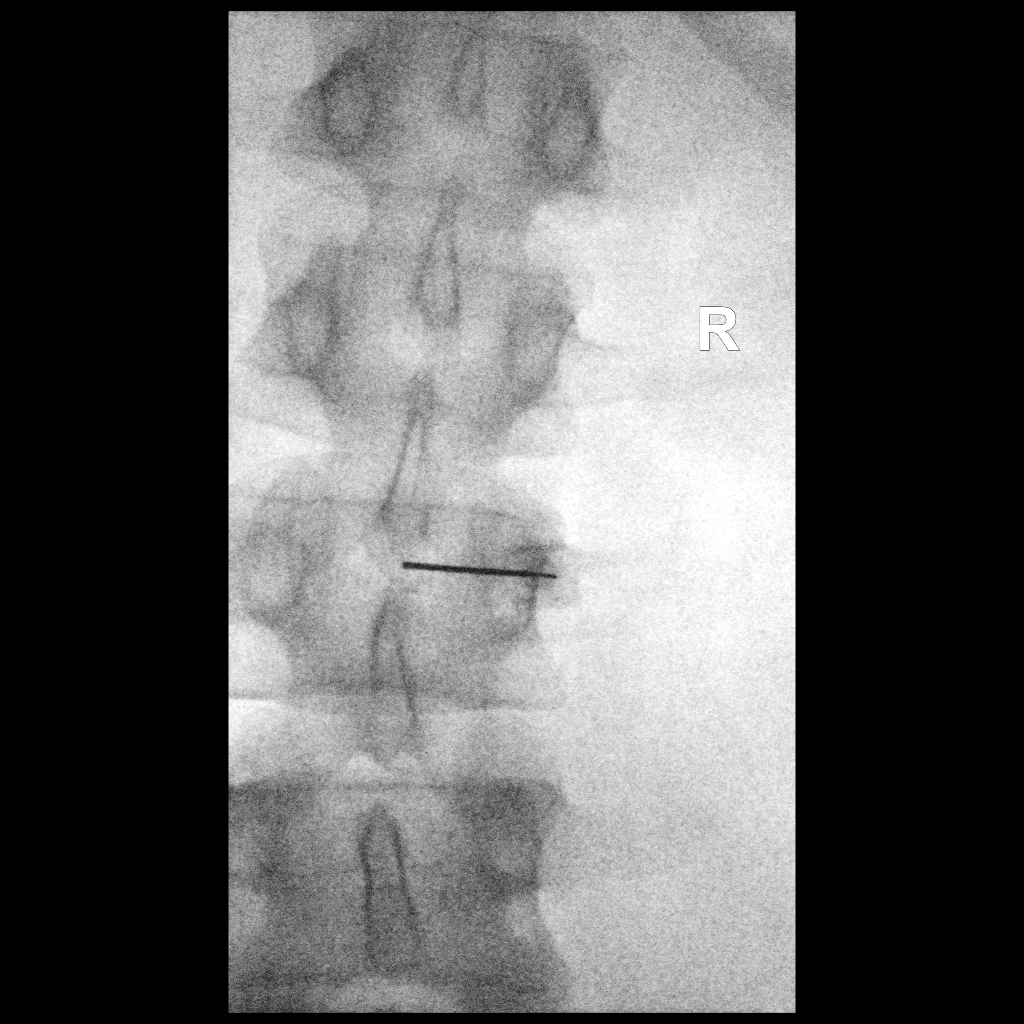

[1 of 1 positions shown; findings below may reference images not displayed]

EXAM:
DIAGNOSTIC LUMBAR PUNCTURE UNDER FLUOROSCOPIC GUIDANCE

FLUOROSCOPY TIME:  Fluoroscopy Time: 0 minutes 11 seconds.
micro gray meter squared

PROCEDURE:
Informed consent was obtained from the patient prior to the
procedure, including potential complications of headache, allergy,
and pain. With the patient prone, the lower back was prepped with
Betadine. 1% Lidocaine was used for local anesthesia. Lumbar
puncture was performed at the right L2-3 level using a 20 gauge
needle with return of clear CSF with an opening pressure of 35 cm
water, measured in the lateral decubitus position. Twenty ml of CSF
were obtained for laboratory studies. Closing pressure was 10 cm
water. The patient tolerated the procedure well and there were no
apparent complications.
IMPRESSION: Lumbar puncture on the right at L2-3. Elevated opening pressure at
35 cm of water. 20 cc clear CSF collected and sent for requested
studies. Closing pressure 10 cm water.

## 2021-04-26 IMAGING — DX DG CHEST 1V PORT
1 series · 1 of 1 positions shown · non-contrast
Comparison: None.

CLINICAL DATA: Shortness of breath.

EXAM:
PORTABLE CHEST 1 VIEW

[chest ap]
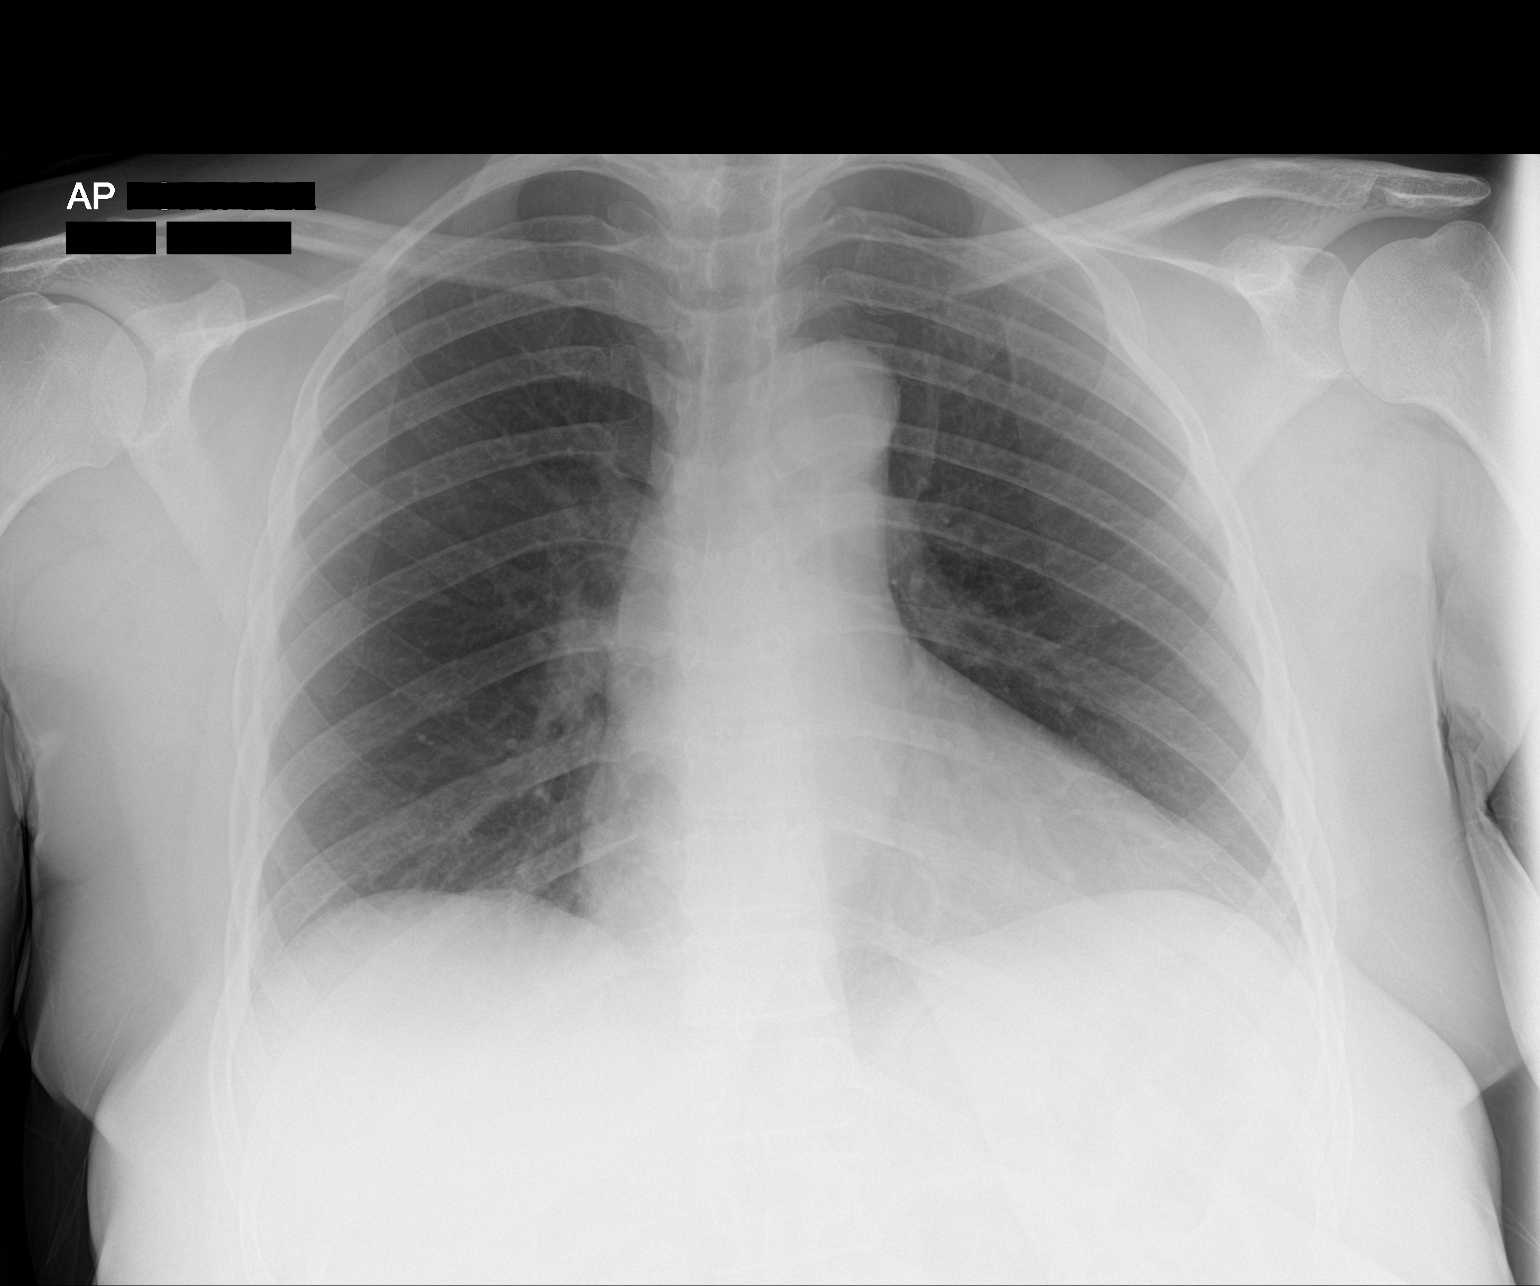

[1 of 1 positions shown; findings below may reference images not displayed]

FINDINGS: The heart size and mediastinal contours are within normal limits.
Both lungs are clear. The visualized skeletal structures are
unremarkable.
IMPRESSION: No active disease.

## 2022-08-01 ENCOUNTER — Emergency Department (HOSPITAL_BASED_OUTPATIENT_CLINIC_OR_DEPARTMENT_OTHER)
Admission: EM | Admit: 2022-08-01 | Discharge: 2022-08-02 | Disposition: A | Discharge status: Home / Self Care | Payer: 59 | Attending: Emergency Medicine | Admitting: Emergency Medicine

## 2022-08-01 ENCOUNTER — Encounter (HOSPITAL_BASED_OUTPATIENT_CLINIC_OR_DEPARTMENT_OTHER): Payer: Self-pay | Admitting: Urology

## 2022-08-01 ENCOUNTER — Emergency Department (HOSPITAL_BASED_OUTPATIENT_CLINIC_OR_DEPARTMENT_OTHER): Payer: 59

## 2022-08-01 ENCOUNTER — Other Ambulatory Visit: Payer: Self-pay

## 2022-08-01 DIAGNOSIS — J0191 Acute recurrent sinusitis, unspecified: Secondary | ICD-10-CM | POA: Diagnosis present

## 2022-08-01 DIAGNOSIS — H538 Other visual disturbances: Secondary | ICD-10-CM | POA: Insufficient documentation

## 2022-08-01 DIAGNOSIS — J0100 Acute maxillary sinusitis, unspecified: Secondary | ICD-10-CM

## 2022-08-01 DIAGNOSIS — J019 Acute sinusitis, unspecified: Secondary | ICD-10-CM | POA: Insufficient documentation

## 2022-08-01 DIAGNOSIS — J320 Chronic maxillary sinusitis: Secondary | ICD-10-CM

## 2022-08-01 LAB — CBC WITH DIFFERENTIAL/PLATELET
Abs Immature Granulocytes: 0.01 10*3/uL (ref 0.00–0.07)
Basophils Absolute: 0.1 10*3/uL (ref 0.0–0.1)
Basophils Relative: 1 %
Eosinophils Absolute: 0.2 10*3/uL (ref 0.0–0.5)
Eosinophils Relative: 3 %
HCT: 35.7 % — ABNORMAL LOW (ref 36.0–46.0)
Hemoglobin: 11.9 g/dL — ABNORMAL LOW (ref 12.0–15.0)
Immature Granulocytes: 0 %
Lymphocytes Relative: 31 %
Lymphs Abs: 2.1 10*3/uL (ref 0.7–4.0)
MCH: 30.7 pg (ref 26.0–34.0)
MCHC: 33.3 g/dL (ref 30.0–36.0)
MCV: 92 fL (ref 80.0–100.0)
Monocytes Absolute: 0.8 10*3/uL (ref 0.1–1.0)
Monocytes Relative: 12 %
Neutro Abs: 3.4 10*3/uL (ref 1.7–7.7)
Neutrophils Relative %: 53 %
Platelets: 336 10*3/uL (ref 150–400)
RBC: 3.88 MIL/uL (ref 3.87–5.11)
RDW: 12.7 % (ref 11.5–15.5)
WBC: 6.5 10*3/uL (ref 4.0–10.5)
nRBC: 0 % (ref 0.0–0.2)

## 2022-08-01 LAB — BASIC METABOLIC PANEL
Anion gap: 9 (ref 5–15)
BUN: 10 mg/dL (ref 6–20)
CO2: 24 mmol/L (ref 22–32)
Calcium: 8.7 mg/dL — ABNORMAL LOW (ref 8.9–10.3)
Chloride: 102 mmol/L (ref 98–111)
Creatinine, Ser: 0.78 mg/dL (ref 0.44–1.00)
GFR, Estimated: >60 mL/min (ref >60)
Glucose, Bld: 94 mg/dL (ref 70–99)
Potassium: 3.4 mmol/L — ABNORMAL LOW (ref 3.5–5.1)
Sodium: 135 mmol/L (ref 135–145)

## 2022-08-01 LAB — PREGNANCY, URINE: Preg Test, Ur: NEGATIVE

## 2022-08-01 MED ORDER — POTASSIUM CHLORIDE CRYS ER 20 MEQ PO TBCR
20.0000 meq | EXTENDED_RELEASE_TABLET | Freq: Once | ORAL | Status: AC
  Administered 2022-08-02: 20 meq via ORAL
  Filled 2022-08-01: qty 1

## 2022-08-01 MED ORDER — IOHEXOL 300 MG/ML  SOLN
75.0000 mL | Freq: Once | INTRAMUSCULAR | Status: AC | PRN
  Administered 2022-08-01: 75 mL via INTRAVENOUS

## 2022-08-01 NOTE — ED Triage Notes (Signed)
Pt states oral sx a few weeks ago and got a sinus infection  Was given amoxicillin and states still has green mucus and fells like glands are swollen and right eye is swelling now too  Finished amox. 3 days ago

## 2022-08-01 NOTE — ED Provider Notes (Incomplete)
[place] [facility] Provider Note   [name]: [id number] Arrival date & time: 08/01/22  2025     History {Add pertinent medical, surgical, social history, OB history to HPI:1} Chief Complaint  Patient presents with  . Sinus Pressure    Connie Warner is a 42 y.o. female status post oral surgery 3 weeks ago presented with recurrent sinusitis.  Patient was put on amoxicillin and has finished her course and still endorses sinusitis symptoms.  Patient states symptoms are worse on the right side of her face and she feels that there is pressure going into her right eye.  Patient states today she noticed some blurry vision but does not endorse any redness or swelling around the eye or any eye pain.  Patient does not wear glasses or contacts.  Patient that she still has fevers and chills but has not taken her temperature.  Patient continues to endorse sinusitis symptoms including spitting up green mucus with postnasal drip.  Patient also endorses nausea without vomiting.  Patient still tolerating food and fluids orally.  Patient denies facial swelling, chest pain, shortness of breath, abdominal pain, changes sensation/motor skills, dizziness  Home Medications Prior to Admission medications   Medication Sig Start Date End Date Taking? Authorizing Provider  acetaZOLAMIDE (DIAMOX) 500 MG capsule Take 1 capsule (500 mg total) by mouth 2 (two) times daily. 03/19/20   Drema Dallas, DO  benzonatate (TESSALON) 100 MG capsule Take 1 capsule (100 mg total) by mouth every 8 (eight) hours as needed for cough. 04/13/20   Sponseller, Eugene Gavia, PA-C  HYDROcodone-acetaminophen (HYCET) 7.5-325 mg/15 ml solution Take 10 mLs by mouth every 6 (six) hours as needed (for pain). 04/15/20   Molpus, John, MD  sertraline (ZOLOFT) 50 MG tablet Take 50 mg by mouth as needed. 01/03/20   [provider]  valsartan-hydrochlorothiazide (DIOVAN-HCT) 80-12.5 MG tablet Take 1 tablet by mouth  every morning. 11/30/19   [provider]  FLUoxetine (PROZAC) 20 MG capsule Take 20 mg by mouth daily. Patient not taking: Reported on 02/20/2020  04/15/20  [provider]  losartan-hydrochlorothiazide (HYZAAR) 100-25 MG tablet Take 1 tablet by mouth daily. Patient not taking: Reported on 02/20/2020  04/15/20  [provider]      Allergies    Patient has no known allergies.    Review of Systems   Review of Systems See HPI Physical Exam Updated Vital Signs BP (!) 141/106 (BP Location: Left Arm)   Pulse 88   Temp 98.3 F (36.8 C) (Oral)   Resp 20   Ht 5\' 4"  (1.626 m)   Wt 83.9 kg   SpO2 96%   BMI 31.75 kg/m  Physical Exam Constitutional:      General: She is not in acute distress. HENT:     Head:     Comments: Bilateral maxillary sinuses tender to palpation, more tender on the right side, no facial swelling    Right Ear: Ear canal and external ear normal. There is impacted cerumen.     Left Ear: Tympanic membrane, ear canal and external ear normal.     Nose: Congestion present.     Mouth/Throat:     Mouth: Mucous membranes are moist.     Pharynx: No oropharyngeal exudate or posterior oropharyngeal erythema.     Comments: No exudates, purulent drainage, exposed bone, erythema, edema Eyes:     Extraocular Movements: Extraocular movements intact.     Conjunctiva/sclera: Conjunctivae normal.  Pupils: Pupils are equal, round, and reactive to light.     Comments: No erythema or edema noted around eyes  Musculoskeletal:     Cervical back: Normal range of motion and neck supple. No rigidity or tenderness.  Lymphadenopathy:     Cervical: No cervical adenopathy.  Neurological:     Mental Status: She is alert.  Psychiatric:        Mood and Affect: Mood normal.     ED Results / Procedures / Treatments   Labs (all labs ordered are listed, but only abnormal results are displayed) Labs Reviewed  BASIC METABOLIC PANEL  CBC WITH  DIFFERENTIAL/PLATELET  PREGNANCY, URINE    EKG None  Radiology No results found.  Procedures Procedures  {Document cardiac monitor, telemetry assessment procedure when appropriate:1}  Medications Ordered in ED Medications - No data to display  ED Course/ Medical Decision Making/ A&P   {   Click here for ABCD2, HEART and other calculatorsREFRESH Note before signing :1}                          Medical Decision Making Amount and/or Complexity of Data Reviewed Labs: ordered. Radiology: ordered.   Virl Son 51 y.o. presented today for sinusitis. Working DDx that I considered at this time includes, but not limited to, sinusitis, preseptal cellulitis, postsurgical complication, oral infection.  R/o DDx: ***: These are considered less likely due to history of present illness and physical exam findings  Review of prior external notes: 07/18/2022 office visit  Unique Tests and My Interpretation:  Urine pregnancy: Negative CBC: Unremarkable BMP: Unremarkable CT maxillofacial with contrast:  Discussion with Independent Historian: None  Discussion of Management of Tests: None  Risk: Medium: prescription drug management  Risk Stratification Score: None  Plan: Patient presented for recurrent sinusitis. On exam patient was in no acute distress and stable vitals.  On exam patient did have tenderness bilaterally in the maxillary sinuses and more tender on the right side however did not note any overlying skin color changes or edema.  Patient does endorse fevers at home without a temperature and other sinusitis symptoms after completing amoxicillin.  Labs to be ordered along with CT maxillofacial as patient had recent oral surgery before her 6 sinus symptoms began.  Patient stable at this time and not requiring any medications.  Patient was given return precautions. Patient stable for discharge at this time.  Patient verbalized understanding of plan.   {Document critical  care time when appropriate:1} {Document review of labs and clinical decision tools ie heart score, Chads2Vasc2 etc:1}  {Document your independent review of radiology images, and any outside records:1} {Document your discussion with family members, caretakers, and with consultants:1} {Document social determinants of health affecting pt's care:1} {Document your decision making why or why not admission, treatments were needed:1} Final Clinical Impression(s) / ED Diagnoses Final diagnoses:  None    Rx / DC Orders ED Discharge Orders     None

## 2022-08-01 NOTE — ED Provider Notes (Signed)
[place] [facility] Provider Note   [name]: [id number] Arrival date & time: 08/01/22  2025     History {Add pertinent medical, surgical, social history, OB history to HPI:1} Chief Complaint  Patient presents with  . Sinus Pressure    Connie Warner is a 42 y.o. female status post oral surgery 3 weeks ago presented with recurrent sinusitis.  Patient was put on amoxicillin and has finished her course and still endorses sinusitis symptoms.  Patient states symptoms are worse on the right side of her face and she feels that there is pressure going into her right eye.  Patient states today she noticed some blurry vision but does not endorse any redness or swelling around the eye or any eye pain.  Patient does not wear glasses or contacts.  Patient that she still has fevers and chills but has not taken her temperature.  Patient continues to endorse sinusitis symptoms including spitting up green mucus with postnasal drip.  Patient also endorses nausea without vomiting.  Patient still tolerating food and fluids orally.  Patient denies facial swelling, chest pain, shortness of breath, abdominal pain, changes sensation/motor skills, dizziness  Home Medications Prior to Admission medications   Medication Sig Start Date End Date Taking? Authorizing Provider  acetaZOLAMIDE (DIAMOX) 500 MG capsule Take 1 capsule (500 mg total) by mouth 2 (two) times daily. 03/19/20   Drema Dallas, DO  benzonatate (TESSALON) 100 MG capsule Take 1 capsule (100 mg total) by mouth every 8 (eight) hours as needed for cough. 04/13/20   Sponseller, Eugene Gavia, PA-C  HYDROcodone-acetaminophen (HYCET) 7.5-325 mg/15 ml solution Take 10 mLs by mouth every 6 (six) hours as needed (for pain). 04/15/20   Molpus, John, MD  sertraline (ZOLOFT) 50 MG tablet Take 50 mg by mouth as needed. 01/03/20   [provider]  valsartan-hydrochlorothiazide (DIOVAN-HCT) 80-12.5 MG tablet Take 1 tablet by mouth  every morning. 11/30/19   [provider]  FLUoxetine (PROZAC) 20 MG capsule Take 20 mg by mouth daily. Patient not taking: Reported on 02/20/2020  04/15/20  [provider]  losartan-hydrochlorothiazide (HYZAAR) 100-25 MG tablet Take 1 tablet by mouth daily. Patient not taking: Reported on 02/20/2020  04/15/20  [provider]      Allergies    Patient has no known allergies.    Review of Systems   Review of Systems See HPI Physical Exam Updated Vital Signs BP (!) 141/106 (BP Location: Left Arm)   Pulse 88   Temp 98.3 F (36.8 C) (Oral)   Resp 20   Ht 5\' 4"  (1.626 m)   Wt 83.9 kg   SpO2 96%   BMI 31.75 kg/m  Physical Exam  ED Results / Procedures / Treatments   Labs (all labs ordered are listed, but only abnormal results are displayed) Labs Reviewed  BASIC METABOLIC PANEL  CBC WITH DIFFERENTIAL/PLATELET  PREGNANCY, URINE    EKG None  Radiology No results found.  Procedures Procedures  {Document cardiac monitor, telemetry assessment procedure when appropriate:1}  Medications Ordered in ED Medications - No data to display  ED Course/ Medical Decision Making/ A&P   {   Click here for ABCD2, HEART and other calculatorsREFRESH Note before signing :1}                          Medical Decision Making Amount and/or Complexity of Data Reviewed Labs: ordered. Radiology: ordered.   ***  {Document  critical care time when appropriate:1} {Document review of labs and clinical decision tools ie heart score, Chads2Vasc2 etc:1}  {Document your independent review of radiology images, and any outside records:1} {Document your discussion with family members, caretakers, and with consultants:1} {Document social determinants of health affecting pt's care:1} {Document your decision making why or why not admission, treatments were needed:1} Final Clinical Impression(s) / ED Diagnoses Final diagnoses:  None    Rx / DC Orders ED Discharge Orders      None

## 2022-08-02 MED ORDER — AMOXICILLIN-POT CLAVULANATE 875-125 MG PO TABS
1.0000 | ORAL_TABLET | Freq: Once | ORAL | Status: AC
  Administered 2022-08-02: 1 via ORAL
  Filled 2022-08-02: qty 1

## 2022-08-02 MED ORDER — FLUCONAZOLE 150 MG PO TABS: ORAL_TABLET | ORAL | 0 refills | Status: AC

## 2022-08-02 MED ORDER — AMOXICILLIN-POT CLAVULANATE 875-125 MG PO TABS: 1.0000 | ORAL_TABLET | Freq: Two times a day (BID) | ORAL | 0 refills | Status: AC

## 2022-08-02 NOTE — Discharge Instructions (Signed)
Please pick up the antibiotics I have prescribed for you regarding recent symptoms ER visit.  Today your CT showed sinusitis and since you have tried amoxicillin in the past we will try Augmentin.  You are given 1 dose of Augmentin in the ED before discharge.  Please follow-up with your ENT specialist regarding recent symptoms and ER visit.  If symptoms worsen please return to ER.  Please contact your oral surgeon Monday and let him or her know that your CT shows right odontogenic maxillary sinusitis.

## 2022-08-13 ENCOUNTER — Other Ambulatory Visit (HOSPITAL_BASED_OUTPATIENT_CLINIC_OR_DEPARTMENT_OTHER): Payer: Self-pay

## 2022-08-13 ENCOUNTER — Encounter (HOSPITAL_BASED_OUTPATIENT_CLINIC_OR_DEPARTMENT_OTHER): Payer: Self-pay | Admitting: Emergency Medicine

## 2022-08-13 ENCOUNTER — Emergency Department (HOSPITAL_BASED_OUTPATIENT_CLINIC_OR_DEPARTMENT_OTHER)
Admission: EM | Admit: 2022-08-13 | Discharge: 2022-08-13 | Disposition: A | Discharge status: Home / Self Care | Payer: 59 | Attending: Emergency Medicine | Admitting: Emergency Medicine

## 2022-08-13 ENCOUNTER — Other Ambulatory Visit: Payer: Self-pay

## 2022-08-13 ENCOUNTER — Emergency Department (HOSPITAL_BASED_OUTPATIENT_CLINIC_OR_DEPARTMENT_OTHER): Payer: 59

## 2022-08-13 DIAGNOSIS — J329 Chronic sinusitis, unspecified: Secondary | ICD-10-CM | POA: Diagnosis not present

## 2022-08-13 DIAGNOSIS — I10 Essential (primary) hypertension: Secondary | ICD-10-CM | POA: Diagnosis not present

## 2022-08-13 DIAGNOSIS — R519 Headache, unspecified: Secondary | ICD-10-CM | POA: Diagnosis present

## 2022-08-13 DIAGNOSIS — Z79899 Other long term (current) drug therapy: Secondary | ICD-10-CM | POA: Insufficient documentation

## 2022-08-13 LAB — CBC WITH DIFFERENTIAL/PLATELET
Abs Immature Granulocytes: 0 10*3/uL (ref 0.00–0.07)
Basophils Absolute: 0.1 10*3/uL (ref 0.0–0.1)
Basophils Relative: 1 %
Eosinophils Absolute: 0.1 10*3/uL (ref 0.0–0.5)
Eosinophils Relative: 4 %
HCT: 39 % (ref 36.0–46.0)
Hemoglobin: 13 g/dL (ref 12.0–15.0)
Immature Granulocytes: 0 %
Lymphocytes Relative: 40 %
Lymphs Abs: 1.4 10*3/uL (ref 0.7–4.0)
MCH: 30.6 pg (ref 26.0–34.0)
MCHC: 33.3 g/dL (ref 30.0–36.0)
MCV: 91.8 fL (ref 80.0–100.0)
Monocytes Absolute: 0.4 10*3/uL (ref 0.1–1.0)
Monocytes Relative: 12 %
Neutro Abs: 1.6 10*3/uL — ABNORMAL LOW (ref 1.7–7.7)
Neutrophils Relative %: 43 %
Platelets: 326 10*3/uL (ref 150–400)
RBC: 4.25 MIL/uL (ref 3.87–5.11)
RDW: 12.7 % (ref 11.5–15.5)
WBC: 3.6 10*3/uL — ABNORMAL LOW (ref 4.0–10.5)
nRBC: 0 % (ref 0.0–0.2)

## 2022-08-13 LAB — BASIC METABOLIC PANEL
Anion gap: 10 (ref 5–15)
BUN: 12 mg/dL (ref 6–20)
CO2: 25 mmol/L (ref 22–32)
Calcium: 8.8 mg/dL — ABNORMAL LOW (ref 8.9–10.3)
Chloride: 100 mmol/L (ref 98–111)
Creatinine, Ser: 0.82 mg/dL (ref 0.44–1.00)
GFR, Estimated: >60 mL/min (ref >60)
Glucose, Bld: 82 mg/dL (ref 70–99)
Potassium: 3.5 mmol/L (ref 3.5–5.1)
Sodium: 135 mmol/L (ref 135–145)

## 2022-08-13 MED ORDER — PROCHLORPERAZINE EDISYLATE 10 MG/2ML IJ SOLN
10.0000 mg | Freq: Once | INTRAMUSCULAR | Status: AC
  Administered 2022-08-13: 10 mg via INTRAVENOUS
  Filled 2022-08-13: qty 2

## 2022-08-13 MED ORDER — CLINDAMYCIN HCL 300 MG PO CAPS
300.0000 mg | ORAL_CAPSULE | Freq: Three times a day (TID) | ORAL | 0 refills | Status: AC
  Filled 2022-08-13: qty 21, 7d supply, fill #0

## 2022-08-13 MED ORDER — DIPHENHYDRAMINE HCL 50 MG/ML IJ SOLN
12.5000 mg | Freq: Once | INTRAMUSCULAR | Status: AC
  Administered 2022-08-13: 12.5 mg via INTRAVENOUS
  Filled 2022-08-13: qty 1

## 2022-08-13 MED ORDER — IOHEXOL 300 MG/ML  SOLN
100.0000 mL | Freq: Once | INTRAMUSCULAR | Status: AC | PRN
  Administered 2022-08-13: 75 mL via INTRAVENOUS

## 2022-08-13 NOTE — ED Notes (Signed)
Pt states she has not taken her blood pressure medications today because she has not ate yet today

## 2022-08-13 NOTE — ED Provider Notes (Signed)
[place] [facility] Provider Note   [name]: [id number] Arrival date & time: 08/13/22  1123     History  Chief Complaint  Patient presents with   Headache    Connie Warner is a 42 y.o. female.  Patient here with right-sided headache on and off for the last few weeks ever since the dental surgery.  She finished a course of antibiotics after she had maybe like a sinus infection secondary to the tooth being extracted.  She followed up with her oral surgeon and overall sounds like she has been healing well but still with right-sided headaches.  She has narcotic pain medicine which has helped.  Denies any chest pain or shortness of breath or vision changes or numbness or weakness or chills.  She has a history of hypertension.  She denies any fevers.  The history is provided by the patient.       Home Medications Prior to Admission medications   Medication Sig Start Date End Date Taking? Authorizing Provider  clindamycin (CLEOCIN) 300 MG capsule Take 1 capsule (300 mg total) by mouth 3 (three) times daily for 7 days. 08/13/22 08/20/22 Yes Rudie Sermons, DO  acetaZOLAMIDE (DIAMOX) 500 MG capsule Take 1 capsule (500 mg total) by mouth 2 (two) times daily. 03/19/20   Drema Dallas, DO  benzonatate (TESSALON) 100 MG capsule Take 1 capsule (100 mg total) by mouth every 8 (eight) hours as needed for cough. 04/13/20   Sponseller, Eugene Gavia, PA-C  fluconazole (DIFLUCAN) 150 MG tablet Take 1 tablet as needed for vaginal yeast infection.  May repeat in 3 days if symptoms persist. 08/02/22   Molpus, Jonny Ruiz, MD  HYDROcodone-acetaminophen (HYCET) 7.5-325 mg/15 ml solution Take 10 mLs by mouth every 6 (six) hours as needed (for pain). 04/15/20   Molpus, John, MD  sertraline (ZOLOFT) 50 MG tablet Take 50 mg by mouth as needed. 01/03/20   [provider]  valsartan-hydrochlorothiazide (DIOVAN-HCT) 80-12.5 MG tablet Take 1 tablet by mouth every morning. 11/30/19    [provider]  FLUoxetine (PROZAC) 20 MG capsule Take 20 mg by mouth daily. Patient not taking: Reported on 02/20/2020  04/15/20  [provider]  losartan-hydrochlorothiazide (HYZAAR) 100-25 MG tablet Take 1 tablet by mouth daily. Patient not taking: Reported on 02/20/2020  04/15/20  [provider]      Allergies    Patient has no known allergies.    Review of Systems   Review of Systems  Physical Exam Updated Vital Signs BP (!) 136/90   Pulse 70   Temp 98.3 F (36.8 C)   Resp 14   Wt 84 kg   SpO2 97%   BMI 31.79 kg/m  Physical Exam Vitals and nursing note reviewed.  Constitutional:      General: She is not in acute distress.    Appearance: She is well-developed. She is not ill-appearing.  HENT:     Head: Normocephalic and atraumatic.     Mouth/Throat:     Mouth: Mucous membranes are moist.  Eyes:     Extraocular Movements: Extraocular movements intact.     Right eye: Normal extraocular motion and no nystagmus.     Left eye: Normal extraocular motion and no nystagmus.     Conjunctiva/sclera: Conjunctivae normal.     Pupils: Pupils are equal, round, and reactive to light.  Cardiovascular:     Rate and Rhythm: Normal rate and regular rhythm.     Heart sounds: Normal heart  sounds. No murmur heard. Pulmonary:     Effort: Pulmonary effort is normal. No respiratory distress.     Breath sounds: Normal breath sounds.  Abdominal:     Palpations: Abdomen is soft.     Tenderness: There is no abdominal tenderness.  Musculoskeletal:        General: No swelling. Normal range of motion.     Cervical back: Normal range of motion and neck supple.  Skin:    General: Skin is warm and dry.     Capillary Refill: Capillary refill takes less than 2 seconds.  Neurological:     Mental Status: She is alert and oriented to person, place, and time.     Cranial Nerves: No cranial nerve deficit or dysarthria.     Sensory: No sensory deficit.     Motor: No  weakness.     Coordination: Coordination normal.     Gait: Gait normal.     Comments: 5+ out of 5 strength, normal sensation, normal speech, normal visual fields  Psychiatric:        Mood and Affect: Mood normal.     ED Results / Procedures / Treatments   Labs (all labs ordered are listed, but only abnormal results are displayed) Labs Reviewed  CBC WITH DIFFERENTIAL/PLATELET - Abnormal; Notable for the following components:      Result Value   WBC 3.6 (*)    Neutro Abs 1.6 (*)    All other components within normal limits  BASIC METABOLIC PANEL - Abnormal; Notable for the following components:   Calcium 8.8 (*)    All other components within normal limits    EKG None  Radiology CT Maxillofacial W Contrast  Result Date: 08/13/2022 CLINICAL DATA:  Pain post surgery. Headaches since oral surgery for wisdom teeth last month. EXAM: CT MAXILLOFACIAL WITH CONTRAST TECHNIQUE: Multidetector CT imaging of the maxillofacial structures was performed with intravenous contrast. Multiplanar CT image reconstructions were also generated. RADIATION DOSE REDUCTION: This exam was performed according to the departmental dose-optimization program which includes automated exposure control, adjustment of the mA and/or kV according to patient size and/or use of iterative reconstruction technique. CONTRAST:  75mL OMNIPAQUE IOHEXOL 300 MG/ML  SOLN COMPARISON:  Maxillofacial CT 08/01/2022. FINDINGS: Osseous: Redemonstrated right maxillary third molar extraction cavity. Similar to the prior maxillofacial CT of 08/01/2022, there is osseous thinning and possible disruption along the floor of the right maxillary sinus at this extraction site (for instance as seen on series 8, images 29 and 30). Right mandibular third molar, left maxillary second and third molar, and left mandibular third molar extraction sites are also noted. Orbits: No orbital mass or acute orbital finding. Sinuses: Persistent complete opacification of  the right maxillary sinus. Moderate-sized fluid level, and mild background mucosal thickening, within the left sphenoid sinus. Extensive opacification of anterior right ethmoid air cells. Near complete opacification of the right frontal sinus. Also noted, there is nonspecific opacification of the right maxillary sinus ostium, ethmoid infundibulum, hiatus semilunaris and superior aspect of the right nasal passage. Soft tissues: No soft tissue abscess or definite perioral/facial sign of soft tissue inflammatory changes are identified. Limited intracranial: No acute intracranial abnormality identified within the field of view. Partially empty sella turcica. IMPRESSION: 1. Redemonstrated right maxillary third molar, right mandibular third molar, left maxillary second and third molar and left mandibular third molar extraction sites. 2. Similar to the prior maxillofacial CT of 08/01/2022, there is osseous thinning and possible disruption along the floor of the right  maxillary sinus at the right maxillary third molar extraction site, and a component of right maxillary odontogenic sinusitis cannot be excluded. However, severe right maxillary, anterior ethmoid and frontal sinus disease has a right ostiomeatal unit obstructive pattern. There is nonspecific opacification of the right maxillary sinus ostium, ethmoid infundibulum, hiatus semilunaris and superior aspect of the right nasal passage. ENT consultation is recommended. 3. Moderate left sphenoid sinusitis. 4. No soft tissue abscess or perioral/facial soft tissue inflammatory changes are identified. 5. Partially empty sella turcica. This finding can reflect incidental anatomic variation, or alternatively, it can be associated with idiopathic intracranial hypertension (pseudotumor cerebri). Electronically Signed   By: Jackey Loge D.O.   On: 08/13/2022 13:36   CT Head Wo Contrast  Result Date: 08/13/2022 CLINICAL DATA:  Headaches EXAM: CT HEAD WITHOUT CONTRAST  TECHNIQUE: Contiguous axial images were obtained from the base of the skull through the vertex without intravenous contrast. RADIATION DOSE REDUCTION: This exam was performed according to the departmental dose-optimization program which includes automated exposure control, adjustment of the mA and/or kV according to patient size and/or use of iterative reconstruction technique. COMPARISON:  MR brain done on 03/07/2020 FINDINGS: Brain: No acute intracranial findings are seen. There are no signs of bleeding within the cranium. Ventricles are not dilated. There is no focal edema or mass effect. Vascular: Unremarkable. Skull: No acute findings are seen. Sinuses/Orbits: There is a mucosal thickening in frontal, ethmoid, sphenoid and right maxillary sinuses. Other: There is increased amount of CSF suggesting possible empty sella. IMPRESSION: No acute intracranial findings are seen in noncontrast CT brain. Increased amount of CSF in the sella may suggest partial empty sella. Chronic frontal, ethmoid, sphenoid and maxillary sinusitis. Electronically Signed   By: Ernie Avena M.D.   On: 08/13/2022 13:16    Procedures Procedures    Medications Ordered in ED Medications  prochlorperazine (COMPAZINE) injection 10 mg (10 mg Intravenous Given 08/13/22 1205)  diphenhydrAMINE (BENADRYL) injection 12.5 mg (12.5 mg Intravenous Given 08/13/22 1203)  iohexol (OMNIPAQUE) 300 MG/ML solution 100 mL (75 mLs Intravenous Contrast Given 08/13/22 1243)    ED Course/ Medical Decision Making/ A&P                             Medical Decision Making Amount and/or Complexity of Data Reviewed Labs: ordered. Radiology: ordered.  Risk Prescription drug management.   Virl Son is here with headache.  Normal vitals.  No fever.  Per my review of chart had maybe some sinusitis process after dental extraction of wisdom teeth a few weeks ago.  Did a course of Augmentin.  Still having some intermittent headaches at times.   Saw oral surgeon for recently.  Surgical sites overall appear clear.  She is neurologically intact.  Right-sided headache right around the ear and teeth and right temporal area.  My suspicion is that this is still postsurgical related issue.  Still might have a sinusitis.  She would like a repeat CT scan which I think is reasonable.  Will get a head CT as well.  Will check basic labs and give headache cocktail with Compazine and Benadryl.  There could be a component of a migraine but I suspect that this is still may be dental/sinus in origin.  I have no concern for stroke or mastoiditis or meningitis.  Lab work per my review and interpretation shows no evidence of cytosis or anemia or electrolyte abnormality.  CT scan of the head  per radiology reports unremarkable.  I do not think she has pseudotumor cerebri.  Symptoms not consistent with.  Overall not much change in her CT scan from a couple weeks ago.  She still has some sinusitis process.  I suspect that this could be secondary to her recent surgery.  Will put her on another course of antibiotics.  She had previously been on Augmentin.  Will trial a course of clindamycin and have her follow-up with ENT.  Patient discharged in good condition.  This chart was dictated using voice recognition software.  Despite best efforts to proofread,  errors can occur which can change the documentation meaning.         Final Clinical Impression(s) / ED Diagnoses Final diagnoses:  Sinusitis, unspecified chronicity, unspecified location    Rx / DC Orders ED Discharge Orders          Ordered    clindamycin (CLEOCIN) 300 MG capsule  3 times daily        08/13/22 1355              Vernie Piet, DO 08/13/22 1356

## 2022-08-13 NOTE — ED Notes (Signed)
Cannot discharge, registration in chart.   

## 2022-08-13 NOTE — ED Notes (Signed)
Report rec'd from prev RN 

## 2022-08-13 NOTE — ED Triage Notes (Signed)
Persistent right side headache since 05/03. Blurry vision at time . Hx HTN . Alert and oriented x 4. Denies NV , n o Hx migraine and no signs of URI .

## 2022-12-23 ENCOUNTER — Encounter (HOSPITAL_BASED_OUTPATIENT_CLINIC_OR_DEPARTMENT_OTHER): Payer: Self-pay | Admitting: Pediatrics

## 2022-12-23 ENCOUNTER — Other Ambulatory Visit: Payer: Self-pay

## 2022-12-23 ENCOUNTER — Emergency Department (HOSPITAL_BASED_OUTPATIENT_CLINIC_OR_DEPARTMENT_OTHER)
Admission: EM | Admit: 2022-12-23 | Discharge: 2022-12-23 | Disposition: A | Discharge status: Home / Self Care | Payer: 59 | Attending: Emergency Medicine | Admitting: Emergency Medicine

## 2022-12-23 DIAGNOSIS — M79602 Pain in left arm: Secondary | ICD-10-CM | POA: Diagnosis present

## 2022-12-23 DIAGNOSIS — M25512 Pain in left shoulder: Secondary | ICD-10-CM

## 2022-12-23 DIAGNOSIS — W109XXA Fall (on) (from) unspecified stairs and steps, initial encounter: Secondary | ICD-10-CM | POA: Diagnosis not present

## 2022-12-23 NOTE — ED Triage Notes (Signed)
C/O left forearm pain that originates in the wrist area, and goes up. Patient reports upper arm tenderness upon palpation, sometimes on the right side as well. 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 Patient stated "Will they be able to check my spine too?" When asked if having back pain patient reported sometimes,

## 2022-12-23 NOTE — Discharge Instructions (Signed)
It was a pleasure caring for you today in the emergency department.  You likely strained a muscle and may have biceps tendonitis.  Please use ice 10-15 minutes 3-4 times per day on your sore areas on your arm and shoulder. You may use topical Voltaren gel 4 times daily, or take ibuprofen or tylenol as needed. Avoid heavy lifting until pain dissipates. Please see your PCP- they may consider physical therapy  Please follow up with your PCP in the next 3-5 days for a recheck. Please return to the emergency department for any worsening or worrisome symptoms.

## 2022-12-23 NOTE — ED Provider Notes (Signed)
[facility] Provider Note   [name]: [id number] Arrival date & time: 12/23/22  1056     History  Chief Complaint  Patient presents with   Arm Pain   Back Pain    Connie Warner is a 42 y.o. female here with left upper arm pain, started about 2 weeks ago.  She says she was fell on the stairs, twisted and grabbed the railing roughly which she thinks may have been the inciting injury. She has been having bicep pain that comes and goes.  She has a history of carpal tunnel syndrome and has been wearing a wrist splint at nighttime intermittently.  She wore it last night, noticed that her pain was better today. She came in today because she felt the tenderness in her bicep muscle today and wanted to make sure that nothing was wrong.  She is in an office job and types all day. No numbness or tingling. No weakness in grip strength. She says it felt like a pinched nerve inside the arm. She is wearing a wrist splint at night, wore it last night and pain is better today. Almost fell down the stairs and caught herself with twisting to grab the hand rail and then her pain started after that.        Home Medications Prior to Admission medications   Medication Sig Start Date End Date Taking? Authorizing Provider  acetaZOLAMIDE (DIAMOX) 500 MG capsule Take 1 capsule (500 mg total) by mouth 2 (two) times daily. 03/19/20   Drema Dallas, DO  benzonatate (TESSALON) 100 MG capsule Take 1 capsule (100 mg total) by mouth every 8 (eight) hours as needed for cough. 04/13/20   Sponseller, Eugene Gavia, PA-C  fluconazole (DIFLUCAN) 150 MG tablet Take 1 tablet as needed for vaginal yeast infection.  May repeat in 3 days if symptoms persist. 08/02/22   Molpus, Jonny Ruiz, MD  HYDROcodone-acetaminophen (HYCET) 7.5-325 mg/15 ml solution Take 10 mLs by mouth every 6 (six) hours as needed (for pain). 04/15/20   Molpus, John, MD  sertraline (ZOLOFT) 50 MG tablet Take 50 mg by mouth as  needed. 01/03/20   [provider]  valsartan-hydrochlorothiazide (DIOVAN-HCT) 80-12.5 MG tablet Take 1 tablet by mouth every morning. 11/30/19   [provider]  FLUoxetine (PROZAC) 20 MG capsule Take 20 mg by mouth daily. Patient not taking: Reported on 02/20/2020  04/15/20  [provider]  losartan-hydrochlorothiazide (HYZAAR) 100-25 MG tablet Take 1 tablet by mouth daily. Patient not taking: Reported on 02/20/2020  04/15/20  [provider]      Allergies    Patient has no known allergies.    Review of Systems   Review of Systems  Musculoskeletal:  Positive for joint swelling and myalgias. Negative for back pain.    Physical Exam Updated Vital Signs BP (!) 153/118 (BP Location: Right Arm)   Pulse 88   Temp 98.1 F (36.7 C) (Oral)   Resp 17   Ht 5\' 4"  (1.626 m)   Wt 83.5 kg   SpO2 100%   BMI 31.58 kg/m  Physical Exam Constitutional:      Appearance: Normal appearance. She is not ill-appearing.  HENT:     Mouth/Throat:     Mouth: Mucous membranes are moist.     Pharynx: Oropharynx is clear.  Cardiovascular:     Rate and Rhythm: Normal rate and regular rhythm.  Pulmonary:     Effort: Pulmonary effort is normal.  Breath sounds: Normal breath sounds.  Musculoskeletal:     Cervical back: Normal range of motion.     Comments: Left arm and shoulder exam: Inspection yields no bruising, erythema.  She does have some mild swelling in the antecubital fossa.  Normal strength, sensation and range of motion.  Negative hook's test.  Positive Yergason's Test  Skin:    General: Skin is warm and dry.  Neurological:     Mental Status: She is alert.     ED Results / Procedures / Treatments   Labs (all labs ordered are listed, but only abnormal results are displayed) Labs Reviewed - No data to display  EKG None  Radiology No results found.  Procedures Procedures    Medications Ordered in ED Medications - No data to display  ED  Course/ Medical Decision Making/ A&P                                 Medical Decision Making Symptoms most consistent with biceps tendinitis versus muscular strain/sprain after try to catch herself when she missed her step going downstairs. Neurovascularly intact with tenderness to palpation at the insertion of the bicep tendon.  She may have a partial tear, but encouraged her to follow-up with her PCP/sports medicine if her pain does not improve with conservative management. No indication for imaging today as I do not have concern for fracture, dislocation, subluxation. Return precautions discussed.  Discharged home in stable condition.           Final Clinical Impression(s) / ED Diagnoses Final diagnoses:  None    Rx / DC Orders ED Discharge Orders     None         Darral Dash, DO 12/23/22 1245    Melene Plan, DO 12/23/22 1249

## 2022-12-29 ENCOUNTER — Other Ambulatory Visit: Payer: Self-pay

## 2023-09-17 ENCOUNTER — Encounter (HOSPITAL_BASED_OUTPATIENT_CLINIC_OR_DEPARTMENT_OTHER): Payer: Self-pay | Admitting: Emergency Medicine

## 2023-09-17 ENCOUNTER — Emergency Department (HOSPITAL_BASED_OUTPATIENT_CLINIC_OR_DEPARTMENT_OTHER)
Admission: EM | Admit: 2023-09-17 | Discharge: 2023-09-17 | Disposition: A | Discharge status: Home / Self Care | Attending: Emergency Medicine | Admitting: Emergency Medicine

## 2023-09-17 ENCOUNTER — Other Ambulatory Visit: Payer: Self-pay

## 2023-09-17 ENCOUNTER — Emergency Department (HOSPITAL_BASED_OUTPATIENT_CLINIC_OR_DEPARTMENT_OTHER)

## 2023-09-17 DIAGNOSIS — R0781 Pleurodynia: Secondary | ICD-10-CM | POA: Diagnosis present

## 2023-09-17 DIAGNOSIS — M940 Chondrocostal junction syndrome [Tietze]: Secondary | ICD-10-CM | POA: Insufficient documentation

## 2023-09-17 LAB — CBC WITH DIFFERENTIAL/PLATELET
Abs Immature Granulocytes: 0.02 10*3/uL (ref 0.00–0.07)
Basophils Absolute: 0.1 10*3/uL (ref 0.0–0.1)
Basophils Relative: 1 %
Eosinophils Absolute: 0.2 10*3/uL (ref 0.0–0.5)
Eosinophils Relative: 4 %
HCT: 39.7 % (ref 36.0–46.0)
Hemoglobin: 13.2 g/dL (ref 12.0–15.0)
Immature Granulocytes: 0 %
Lymphocytes Relative: 37 %
Lymphs Abs: 2 10*3/uL (ref 0.7–4.0)
MCH: 30.7 pg (ref 26.0–34.0)
MCHC: 33.2 g/dL (ref 30.0–36.0)
MCV: 92.3 fL (ref 80.0–100.0)
Monocytes Absolute: 0.5 10*3/uL (ref 0.1–1.0)
Monocytes Relative: 10 %
Neutro Abs: 2.6 10*3/uL (ref 1.7–7.7)
Neutrophils Relative %: 48 %
Platelets: 298 10*3/uL (ref 150–400)
RBC: 4.3 MIL/uL (ref 3.87–5.11)
RDW: 12.6 % (ref 11.5–15.5)
WBC: 5.4 10*3/uL (ref 4.0–10.5)
nRBC: 0 % (ref 0.0–0.2)

## 2023-09-17 LAB — BASIC METABOLIC PANEL WITH GFR
Anion gap: 14 (ref 5–15)
BUN: 12 mg/dL (ref 6–20)
CO2: 23 mmol/L (ref 22–32)
Calcium: 9.5 mg/dL (ref 8.9–10.3)
Chloride: 100 mmol/L (ref 98–111)
Creatinine, Ser: 0.96 mg/dL (ref 0.44–1.00)
GFR, Estimated: >60 mL/min (ref >60)
Glucose, Bld: 101 mg/dL — ABNORMAL HIGH (ref 70–99)
Potassium: 3.1 mmol/L — ABNORMAL LOW (ref 3.5–5.1)
Sodium: 137 mmol/L (ref 135–145)

## 2023-09-17 LAB — TROPONIN T, HIGH SENSITIVITY: Troponin T High Sensitivity: <15 ng/L (ref <19)

## 2023-09-17 MED ORDER — KETOROLAC TROMETHAMINE 60 MG/2ML IM SOLN
30.0000 mg | Freq: Once | INTRAMUSCULAR | Status: AC
  Administered 2023-09-17: 30 mg via INTRAMUSCULAR
  Filled 2023-09-17: qty 2

## 2023-09-17 MED ORDER — CYCLOBENZAPRINE HCL 10 MG PO TABS: 10.0000 mg | ORAL_TABLET | Freq: Two times a day (BID) | ORAL | 0 refills | Status: AC | PRN

## 2023-09-17 MED ORDER — POTASSIUM CHLORIDE CRYS ER 20 MEQ PO TBCR
40.0000 meq | EXTENDED_RELEASE_TABLET | Freq: Once | ORAL | Status: AC
  Administered 2023-09-17: 40 meq via ORAL
  Filled 2023-09-17: qty 2

## 2023-09-17 NOTE — ED Provider Notes (Signed)
 [facility] Provider Note   [name]: [id number] Arrival date & time: 09/17/23  1939     Patient presents with: Chest Pain   Connie Warner is a 43 y.o. female.   This is a 43 year old female who is here today for intermittent pain to the left side of her ribs and trunk.  Patient also reported that she had some pain in her chest last evening.  Patient reports that she has been having more significant pain in her upper back, and legs.  She is attributing this to her somewhat sedentary lifestyle.  She denies any swelling in her legs or shortness of breath.   Chest Pain      Prior to Admission medications   Medication Sig Start Date End Date Taking? Authorizing Provider  cyclobenzaprine (FLEXERIL) 10 MG tablet Take 1 tablet (10 mg total) by mouth 2 (two) times daily as needed for muscle spasms. 09/17/23  Yes Afton Horse T, DO  acetaZOLAMIDE  (DIAMOX ) 500 MG capsule Take 1 capsule (500 mg total) by mouth 2 (two) times daily. 03/19/20   Merriam Abbey, DO  benzonatate  (TESSALON ) 100 MG capsule Take 1 capsule (100 mg total) by mouth every 8 (eight) hours as needed for cough. 04/13/20   Sponseller, Rebekah R, PA-C  fluconazole  (DIFLUCAN ) 150 MG tablet Take 1 tablet as needed for vaginal yeast infection.  May repeat in 3 days if symptoms persist. 08/02/22   Molpus, Autry Legions, MD  HYDROcodone -acetaminophen  (HYCET) 7.5-325 mg/15 ml solution Take 10 mLs by mouth every 6 (six) hours as needed (for pain). 04/15/20   Molpus, John, MD  sertraline (ZOLOFT) 50 MG tablet Take 50 mg by mouth as needed. 01/03/20   [provider]  valsartan-hydrochlorothiazide (DIOVAN-HCT) 80-12.5 MG tablet Take 1 tablet by mouth every morning. 11/30/19   [provider]  FLUoxetine (PROZAC) 20 MG capsule Take 20 mg by mouth daily. Patient not taking: Reported on 02/20/2020  04/15/20  [provider]  losartan-hydrochlorothiazide (HYZAAR) 100-25 MG tablet Take 1  tablet by mouth daily. Patient not taking: Reported on 02/20/2020  04/15/20  [provider]    Allergies: Patient has no known allergies.    Review of Systems  Cardiovascular:  Positive for chest pain.    Updated Vital Signs BP (!) 138/99   Pulse (!) 101   Temp 98.8 F (37.1 C) (Oral)   Resp 20   Ht 5' 5 (1.651 m)   Wt 85.7 kg   SpO2 98%   BMI 31.45 kg/m   Physical Exam Vitals and nursing note reviewed.  Constitutional:      Appearance: She is not ill-appearing.   Cardiovascular:     Heart sounds: Normal heart sounds.  Pulmonary:     Effort: Pulmonary effort is normal.     Breath sounds: Normal breath sounds.  Chest:     Chest wall: Tenderness present.     Comments: Left-sided intercostal muscle tenderness in the lower ribs. Abdominal:     Palpations: Abdomen is soft.     Tenderness: There is no abdominal tenderness.   Musculoskeletal:        General: Normal range of motion.     Right lower leg: No edema.     Left lower leg: No edema.   Neurological:     Mental Status: She is alert.     (all labs ordered are listed, but only abnormal results are displayed) Labs Reviewed  BASIC METABOLIC PANEL WITH GFR -  Abnormal; Notable for the following components:      Result Value   Potassium 3.1 (*)    Glucose, Bld 101 (*)    All other components within normal limits  CBC WITH DIFFERENTIAL/PLATELET  PREGNANCY, URINE  TROPONIN T, HIGH SENSITIVITY    EKG: EKG Interpretation Date/Time:  Thursday September 17 2023 19:50:23 EDT Ventricular Rate:  95 PR Interval:  115 QRS Duration:  91 QT Interval:  358 QTC Calculation: 450 R Axis:   -39  Text Interpretation: Sinus rhythm Borderline short PR interval Left axis deviation Abnormal R-wave progression, early transition Borderline T abnormalities, inferior leads Confirmed by Afton Horse [phone]) on 09/17/2023 10:22:20 PM  Radiology: DG Chest 2 View Result Date: 09/17/2023 CLINICAL DATA:  Chest pain  left-sided EXAM: CHEST - 2 VIEW COMPARISON:  04/13/2020 FINDINGS: The heart size and mediastinal contours are within normal limits. Both lungs are clear. The visualized skeletal structures are unremarkable. IMPRESSION: No active cardiopulmonary disease. Electronically Signed   By: Esmeralda Hedge M.D.   On: 09/17/2023 20:33     Procedures   Medications Ordered in the ED  potassium chloride  SA (KLOR-CON  M) CR tablet 40 mEq (has no administration in time range)  ketorolac  (TORADOL ) injection 30 mg (has no administration in time range)                                    Medical Decision Making 43 year old female here today with pain over the left side of her chest wall.  Plan-patient's pain worse with movement, bending and twisting.  It is reproducible.  Consistent with costochondritis.  Lower suspicion for ACS.  Patient's exam and history not consistent with thromboembolic process.  Heart sounds normal.  My independent review of the patient's EKG shows no ST segment depressions or elevations, no T wave inversions, no evidence of acute ischemia.  Reviewed the patient's medications and confirmed with her at bedside.  Discussed with the patient how a lot of her symptoms are likely related to posture, sedentary lifestyle.  Recommended patient increase her walking, increase physical activity.  Patient agreeable with plan.  Will provide her some Toradol  here.  Will send a few pills of Flexeril.  Will discharge.  Amount and/or Complexity of Data Reviewed Labs: ordered. Radiology: ordered.  Risk Prescription drug management.        Final diagnoses:  Costochondritis    ED Discharge Orders          Ordered    cyclobenzaprine (FLEXERIL) 10 MG tablet  2 times daily PRN        09/17/23 2237               Afton Horse T, DO 09/17/23 2237

## 2023-09-17 NOTE — Discharge Instructions (Addendum)
 While you are in the emergency room, you had blood work done that overall was normal.  Your potassium was a little bit low, so you received some potassium here in the ED.  Try to eat more leafy green vegetables.  We did test to look for injury to your heart and these were all normal.  Your chest x-ray was normal.  Like we discussed, I believe that your symptoms are muscle pain.  You can take Motrin  and Tylenol .  I have also sent your prescription for a muscle relaxer called Flexeril.  Do not take this medicine if you are driving.  Try to walk 30 minutes a day.  Follow-up with your PCP.  Return to the emergency room if you develop new or worsening pain in your chest or difficulty breathing.

## 2023-09-17 NOTE — ED Triage Notes (Signed)
 Pt reports she has intermittent pain to RT side ribs/trunk; had chest pain last night; would also like her spine checked d/t BLE muscles jumping x 2 wks
